# Patient Record
Sex: Female | Born: 1970 | Race: White | Hispanic: No | Marital: Married | Smoking: Current every day smoker
Health system: Southern US, Community
[De-identification: ages and names within clinical notes are randomized; demographics above are authoritative.]

## PROBLEM LIST (undated history)

## (undated) DIAGNOSIS — R6882 Decreased libido: Secondary | ICD-10-CM

## (undated) DIAGNOSIS — Z803 Family history of malignant neoplasm of breast: Secondary | ICD-10-CM

## (undated) DIAGNOSIS — K635 Polyp of colon: Secondary | ICD-10-CM

## (undated) DIAGNOSIS — I10 Essential (primary) hypertension: Secondary | ICD-10-CM

## (undated) HISTORY — DX: Decreased libido: R68.82

## (undated) HISTORY — PX: CHOLECYSTECTOMY, LAPAROSCOPIC: SHX56

## (undated) HISTORY — PX: TUBAL LIGATION: SHX77

## (undated) HISTORY — DX: Polyp of colon: K63.5

## (undated) HISTORY — DX: Essential (primary) hypertension: I10

## (undated) HISTORY — DX: Family history of malignant neoplasm of breast: Z80.3

## (undated) HISTORY — PX: OTHER SURGICAL HISTORY: SHX169

---

## 2006-11-05 HISTORY — PX: TUBAL LIGATION: SHX77

## 2014-07-17 ENCOUNTER — Observation Stay: Payer: Self-pay | Admitting: Obstetrics and Gynecology

## 2014-07-26 ENCOUNTER — Inpatient Hospital Stay: Payer: Self-pay

## 2014-07-26 LAB — BASIC METABOLIC PANEL
Anion Gap: 11 (ref 7–16)
BUN: 4 mg/dL — ABNORMAL LOW (ref 7–18)
Calcium, Total: 8.3 mg/dL — ABNORMAL LOW (ref 8.5–10.1)
Chloride: 105 mmol/L (ref 98–107)
Co2: 23 mmol/L (ref 21–32)
Creatinine: 0.68 mg/dL (ref 0.60–1.30)
EGFR (African American): >60
EGFR (Non-African Amer.): >60
Glucose: 93 mg/dL (ref 65–99)
Osmolality: 274 (ref 275–301)
Potassium: 2.6 mmol/L — ABNORMAL LOW (ref 3.5–5.1)
Sodium: 139 mmol/L (ref 136–145)

## 2014-07-26 LAB — HEMOGLOBIN: HGB: 9.1 g/dL — ABNORMAL LOW (ref 12.0–16.0)

## 2014-07-26 LAB — HEMATOCRIT: HCT: 27.4 % — ABNORMAL LOW (ref 35.0–47.0)

## 2014-07-26 LAB — SGOT (AST)(ARMC): SGOT(AST): 14 U/L — ABNORMAL LOW (ref 15–37)

## 2014-07-26 LAB — PROTEIN / CREATININE RATIO, URINE
Creatinine, Urine: 57.4 mg/dL (ref 30.0–125.0)
Protein, Random Urine: 18 mg/dL — ABNORMAL HIGH (ref 0–12)
Protein/Creat. Ratio: 314 mg/gCREAT — ABNORMAL HIGH (ref 0–200)

## 2014-07-26 LAB — RBC: RBC: 3.01 10*6/uL — ABNORMAL LOW (ref 3.80–5.20)

## 2014-07-26 LAB — PLATELET COUNT: Platelet: 165 10*3/uL (ref 150–440)

## 2014-07-26 LAB — WBC: WBC: 11 10*3/uL (ref 3.6–11.0)

## 2014-07-26 LAB — URIC ACID: Uric Acid: 5.4 mg/dL (ref 2.6–6.0)

## 2014-07-27 LAB — HEMATOCRIT: HCT: 24.9 % — ABNORMAL LOW (ref 35.0–47.0)

## 2015-03-15 NOTE — H&P (Signed)
L&D Evaluation:  History:  HPI Pt is a 44 yo G7P5015 at 39.[redacted] weeks GA with an EDC of 07/30/14 who presents to L&D after being sent over from the office due to her BP being 162/84. She reports a headache that has been persistant over the last "couple of days." She denies RUQ pain, blurred vision, or dizziness. She has a history of gestational HTN vs. pre-eclampsia with another pregnancy- early pregnancy PIH and 24 hour urine normal, anemia, asymptomatic bacteuria, and negative informaseq testing. She is A+, RI, VI, GBS negative. She recieved her Tdap this pregnancy.   Presents with other, [place] work up   Patient's Medical History anemia   Patient's Surgical History other  tooth extraction, BTL with reversal   Medications Pre Natal Vitamins  Iron   Allergies NKDA   Social History tobacco   Family History Non-Contributory   ROS:  ROS All systems were reviewed.  HEENT, CNS, GI, GU, Respiratory, CV, Renal and Musculoskeletal systems were found to be normal.   Exam:  Vital Signs BP 109-138/58-88   General no apparent distress   Mental Status clear   Chest clear   Heart normal sinus rhythm   Abdomen gravid, non-tender   Back no CVAT   Pelvic no external lesions, 4/50/-2 in clinic   Mebranes Intact   FHT 120's, +accels, 1 variable noted   Ucx absent   Skin dry, no lesions, no rashes   Lymph no lymphadenopathy   Other PIH panel- HGB- 9.1, HCT- 27.4, AST- 14, urine PC ratio- 314, uric acid- 5.4, BUN-4 K- 2.6   Impression:  Impression reactive NST, IUP at [redacted]w[redacted]d, elevated BP, proteinuria   Plan:  Plan EFM/NST, Pitocin, medication for pain relief, anticipate svd.   Comments Results discussed with Dr. Kenton Kingfisher- agreed to keep pt overnignt and start an IOL due to elevated BP's in the office, and patients history of GHTN vs. pre-eclampsia along with advanced cervical dilation.   Follow Up Appointment need to schedule   Electronic Signatures: Louisa Second (CNM)   (Signed 21-Sep-15 17:41)  Authored: L&D Evaluation   Last Updated: 21-Sep-15 17:41 by Louisa Second (CNM)

## 2017-03-15 ENCOUNTER — Ambulatory Visit (INDEPENDENT_AMBULATORY_CARE_PROVIDER_SITE_OTHER): Payer: Managed Care, Other (non HMO) | Admitting: Obstetrics and Gynecology

## 2017-03-15 ENCOUNTER — Encounter: Payer: Self-pay | Admitting: Obstetrics and Gynecology

## 2017-03-15 VITALS — BP 128/74 | Ht 66.0 in | Wt 153.0 lb

## 2017-03-15 DIAGNOSIS — R6882 Decreased libido: Secondary | ICD-10-CM

## 2017-03-15 DIAGNOSIS — Z1151 Encounter for screening for human papillomavirus (HPV): Secondary | ICD-10-CM

## 2017-03-15 DIAGNOSIS — Z124 Encounter for screening for malignant neoplasm of cervix: Secondary | ICD-10-CM

## 2017-03-15 DIAGNOSIS — Z01419 Encounter for gynecological examination (general) (routine) without abnormal findings: Secondary | ICD-10-CM

## 2017-03-15 DIAGNOSIS — Z1231 Encounter for screening mammogram for malignant neoplasm of breast: Secondary | ICD-10-CM | POA: Diagnosis not present

## 2017-03-15 DIAGNOSIS — Z803 Family history of malignant neoplasm of breast: Secondary | ICD-10-CM

## 2017-03-15 DIAGNOSIS — Z1239 Encounter for other screening for malignant neoplasm of breast: Secondary | ICD-10-CM

## 2017-03-15 NOTE — Progress Notes (Signed)
Chief Complaint  Patient presents with  . Annual Exam     HPI:      Felicia Huff is a 46 y.o. No obstetric history on file. who LMP was Patient's last menstrual period was 02/20/2017., presents today for her annual examination.  Her menses are regular every 28-30 days, lasting 4 days.  Dysmenorrhea mild, occurring first 1-2 days of flow. She does not have intermenstrual bleeding.  Sex activity: not sexually active due to no desire/libido. Sx worse since birth of youngest daughter, almost 3 yrs ago.  Last Pap: December 28, 2013  Results were: no abnormalities /neg HPV DNA  Hx of STDs: HPV  Last mammogram:[place] There is a FH of breast cancer in her mom, genetic testing not indicated. There is no FH of ovarian cancer. The patient does do self-breast exams.  Tobacco use: The patient currently smokes 1/2 packs of cigarettes per day for the past many years. Alcohol use: none Exercise: moderately active  She does get adequate calcium and Vitamin D in her diet.   History reviewed. No pertinent past medical history.  Past Surgical History:  Procedure Laterality Date  . CHOLECYSTECTOMY, LAPAROSCOPIC    . tubal ligation reversal    . VAGINAL DELIVERY      Family History  Problem Relation Age of Onset  . Breast cancer Mother 7    Social History   Social History  . Marital status: Married    Spouse name: N/A  . Number of children: N/A  . Years of education: N/A   Occupational History  . Not on file.   Social History Main Topics  . Smoking status: Current Every Day Smoker  . Smokeless tobacco: Never Used  . Alcohol use No  . Drug use: No  . Sexual activity: Not Currently     Comment: vasectomy   Other Topics Concern  . Not on file   Social History Narrative  . No narrative on file    No current outpatient prescriptions on file.  ROS:  Review of Systems  Constitutional: Negative for fever, malaise/fatigue and weight loss.  HENT: Negative for  congestion, ear pain and sinus pain.   Respiratory: Negative for cough, shortness of breath and wheezing.   Cardiovascular: Negative for chest pain, orthopnea and leg swelling.  Gastrointestinal: Negative for constipation, diarrhea, nausea and vomiting.  Genitourinary: Negative for dysuria, frequency, hematuria and urgency.       Breast ROS: negative   Musculoskeletal: Negative for back pain, joint pain and myalgias.  Skin: Negative for itching and rash.  Neurological: Negative for dizziness, tingling, focal weakness and headaches.  Endo/Heme/Allergies: Negative for environmental allergies. Does not bruise/bleed easily.  Psychiatric/Behavioral: Negative for depression and suicidal ideas. The patient is not nervous/anxious and does not have insomnia.     Objective: BP 128/74   Ht 5\' 6"  (1.676 m)   Wt 153 lb (69.4 kg)   LMP 02/20/2017   BMI 24.69 kg/m    Physical Exam  Constitutional: She is oriented to person, place, and time. She appears well-developed and well-nourished.  Genitourinary: Vagina normal and uterus normal. There is no rash or tenderness on the right labia. There is no rash or tenderness on the left labia. No erythema or tenderness in the vagina. No vaginal discharge found. Right adnexum does not display mass and does not display tenderness. Left adnexum does not display mass and does not display tenderness. Cervix does not exhibit motion tenderness or polyp. Uterus is not enlarged  or tender.  Neck: Normal range of motion. No thyromegaly present.  Cardiovascular: Normal rate, regular rhythm and normal heart sounds.   No murmur heard. Pulmonary/Chest: Effort normal and breath sounds normal. Right breast exhibits no mass, no nipple discharge, no skin change and no tenderness. Left breast exhibits no mass, no nipple discharge, no skin change and no tenderness.  Abdominal: Soft. There is no tenderness. There is no guarding.  Musculoskeletal: Normal range of motion.    Neurological: She is alert and oriented to person, place, and time. No cranial nerve deficit.  Psychiatric: She has a normal mood and affect. Her behavior is normal.  Vitals reviewed.   Assessment/Plan: Encounter for annual routine gynecological examination  Cervical cancer screening - Plan: IGP, Aptima HPV  Screening for HPV (human papillomavirus) - Plan: IGP, Aptima HPV  Screening for breast cancer - Pt to sched mammo. - Plan: MM SCREENING BREAST TOMO BILATERAL  Family history of breast cancer - Pt doesn't qualify for cancer genetic testing. Will cont to follow FH. - Plan: MM SCREENING BREAST TOMO BILATERAL  Decreased libido - Recommended [facility] in [place]. No med tx available. Discussed date night/romance.            GYN counsel mammography screening, adequate intake of calcium and vitamin D, diet and exercise     F/U  Return in about 1 year (around 03/15/2018).  Felicia B. Copland, PA-C 03/15/2017 11:08 AM

## 2017-03-19 LAB — IGP, APTIMA HPV
HPV Aptima: NEGATIVE
PAP Smear Comment: 0

## 2019-01-12 ENCOUNTER — Encounter: Payer: Self-pay | Admitting: Obstetrics and Gynecology

## 2019-01-12 ENCOUNTER — Ambulatory Visit (INDEPENDENT_AMBULATORY_CARE_PROVIDER_SITE_OTHER): Payer: Medicaid Other | Admitting: Obstetrics and Gynecology

## 2019-01-12 VITALS — BP 118/80 | HR 78 | Ht 66.0 in | Wt 162.0 lb

## 2019-01-12 DIAGNOSIS — Z0000 Encounter for general adult medical examination without abnormal findings: Secondary | ICD-10-CM

## 2019-01-12 DIAGNOSIS — Z01419 Encounter for gynecological examination (general) (routine) without abnormal findings: Secondary | ICD-10-CM

## 2019-01-12 DIAGNOSIS — Z1211 Encounter for screening for malignant neoplasm of colon: Secondary | ICD-10-CM

## 2019-01-12 DIAGNOSIS — Z1239 Encounter for other screening for malignant neoplasm of breast: Secondary | ICD-10-CM

## 2019-01-12 DIAGNOSIS — K635 Polyp of colon: Secondary | ICD-10-CM

## 2019-01-12 DIAGNOSIS — Z803 Family history of malignant neoplasm of breast: Secondary | ICD-10-CM

## 2019-01-12 NOTE — Progress Notes (Signed)
Chief Complaint  Patient presents with  . Gynecologic Exam     HPI:      [name] is a 48 y.o. No obstetric history on file. who LMP was Patient's last menstrual period was 01/05/2019 (approximate)., presents today for her annual examination.  Her menses are regular Q1-2 months, light spotting to lasting 3 days.  Dysmenorrhea mild, occurring first 1-2 days of flow. She does not have intermenstrual bleeding. She does have vasomotor sx.   Sex activity: not sexually active due to no desire/libido. Sx worse since birth of youngest daughter, almost 5 yrs ago. Recommended [facility] in [place] in past.  Last Pap: 03/15/17  Results were: no abnormalities /neg HPV DNA  Hx of STDs: HPV  Last mammogram: not done There is a FH of breast cancer in her mom, genetic testing not indicated. There is no FH of ovarian cancer. The patient does do self-breast exams.  Tobacco use: 1/2 to 1 ppd for many yrs; trying to cut down Alcohol use: none Exercise: moderately active  She does get adequate calcium but not Vitamin D in her diet. Labs with PCP.   Pt with hx of colon polyps on colonoscopy about 11 yrs ago in [place]. Pt was supposed to have repeat in 5 yrs but never done.  Past Medical History:  Diagnosis Date  . Colon polyps   . Decreased libido   . Family history of breast cancer     Past Surgical History:  Procedure Laterality Date  . CHOLECYSTECTOMY, LAPAROSCOPIC    . tubal ligation reversal    . VAGINAL DELIVERY      Family History  Problem Relation Age of Onset  . Breast cancer Mother 41       cured  . Ovarian cancer Neg Hx     Social History   Socioeconomic History  . Marital status: Married    Spouse name: Not on file  . Number of children: Not on file  . Years of education: Not on file  . Highest education level: Not on file  Occupational History  . Not on file  Social Needs  . Financial resource strain: Not on file  . Food insecurity:    Worry: Not on file   Inability: Not on file  . Transportation needs:    Medical: Not on file    Non-medical: Not on file  Tobacco Use  . Smoking status: Current Every Day Smoker    Packs/day: 1.00    Types: Cigarettes  . Smokeless tobacco: Never Used  Substance and Sexual Activity  . Alcohol use: No  . Drug use: No  . Sexual activity: Not Currently    Birth control/protection: Surgical    Comment: vasectomy  Lifestyle  . Physical activity:    Days per week: Not on file    Minutes per session: Not on file  . Stress: Not on file  Relationships  . Social connections:    Talks on phone: Not on file    Gets together: Not on file    Attends religious service: Not on file    Active member of club or organization: Not on file    Attends meetings of clubs or organizations: Not on file    Relationship status: Not on file  . Intimate partner violence:    Fear of current or ex partner: Not on file    Emotionally abused: Not on file    Physically abused: Not on file    Forced sexual  activity: Not on file  Other Topics Concern  . Not on file  Social History Narrative  . Not on file    No current outpatient medications on file.  ROS:  Review of Systems  Constitutional: Negative for fever, malaise/fatigue and weight loss.  HENT: Negative for congestion, ear pain and sinus pain.   Respiratory: Negative for cough, shortness of breath and wheezing.   Cardiovascular: Negative for chest pain, orthopnea and leg swelling.  Gastrointestinal: Negative for constipation, diarrhea, nausea and vomiting.  Genitourinary: Negative for dysuria, frequency, hematuria and urgency.       Breast ROS: negative   Musculoskeletal: Negative for back pain, joint pain and myalgias.  Skin: Negative for itching and rash.  Neurological: Negative for dizziness, tingling, focal weakness and headaches.  Endo/Heme/Allergies: Negative for environmental allergies. Does not bruise/bleed easily.  Psychiatric/Behavioral: Negative for  depression and suicidal ideas. The patient is not nervous/anxious and does not have insomnia.     Objective: BP 118/80   Pulse 78   Ht 5\' 6"  (1.676 m)   Wt 162 lb (73.5 kg)   LMP 01/05/2019 (Approximate)   BMI 26.15 kg/m    Physical Exam Constitutional:      Appearance: She is well-developed.  Genitourinary:     Vulva, vagina, uterus, right adnexa and left adnexa normal.     No vulval lesion or tenderness noted.     No vaginal discharge, erythema or tenderness.     No cervical motion tenderness or polyp.     Uterus is not enlarged or tender.     No right or left adnexal mass present.     Right adnexa not tender.     Left adnexa not tender.  Neck:     Musculoskeletal: Normal range of motion.     Thyroid: No thyromegaly.  Cardiovascular:     Rate and Rhythm: Normal rate and regular rhythm.     Heart sounds: Normal heart sounds. No murmur.  Pulmonary:     Effort: Pulmonary effort is normal.     Breath sounds: Normal breath sounds.  Chest:     Breasts:        Right: No mass, nipple discharge, skin change or tenderness.        Left: No mass, nipple discharge, skin change or tenderness.  Abdominal:     Palpations: Abdomen is soft.     Tenderness: There is no abdominal tenderness. There is no guarding.  Musculoskeletal: Normal range of motion.  Neurological:     General: No focal deficit present.     Mental Status: She is alert and oriented to person, place, and time.     Cranial Nerves: No cranial nerve deficit.  Skin:    General: Skin is warm and dry.  Psychiatric:        Mood and Affect: Mood normal.        Behavior: Behavior normal.        Thought Content: Thought content normal.        Judgment: Judgment normal.  Vitals signs reviewed.     Assessment/Plan: Encounter for annual routine gynecological examination  Screening for breast cancer - Pt to sched mammo - Plan: MM 3D SCREEN BREAST BILATERAL  Family history of breast cancer - Doesn't meet genetic  testing guidelines. Will cont to follow [place]  Screening for colon cancer - Refer to GI for colonoscopy due to hx of colon polyps in past. - Plan: Ambulatory referral to Gastroenterology  Polyp of colon, unspecified  part of colon, unspecified type - Plan: Ambulatory referral to Gastroenterology           GYN counsel mammography screening, adequate intake of calcium and vitamin D, diet and exercise     F/U  Return in about 1 year (around 01/12/2020).  Cataleia Gade B. Virlee Stroschein, PA-C 01/12/2019 2:17 PM

## 2019-01-12 NOTE — Patient Instructions (Addendum)
I value your feedback and entrusting us with your care. If you get a [facility] patient survey, I would appreciate you taking the time to let us know about your experience today. Thank you!  [facility]: [phone]    

## 2019-01-21 ENCOUNTER — Other Ambulatory Visit: Payer: Self-pay

## 2019-01-21 ENCOUNTER — Ambulatory Visit
Admission: RE | Admit: 2019-01-21 | Discharge: 2019-01-21 | Disposition: A | Discharge status: Home / Self Care | Payer: Medicaid Other | Source: Ambulatory Visit | Attending: Obstetrics and Gynecology | Admitting: Obstetrics and Gynecology

## 2019-01-21 DIAGNOSIS — Z1231 Encounter for screening mammogram for malignant neoplasm of breast: Secondary | ICD-10-CM | POA: Insufficient documentation

## 2019-01-21 DIAGNOSIS — Z1239 Encounter for other screening for malignant neoplasm of breast: Secondary | ICD-10-CM

## 2019-01-27 ENCOUNTER — Encounter: Payer: Self-pay | Admitting: Obstetrics and Gynecology

## 2019-02-16 ENCOUNTER — Encounter: Payer: Self-pay | Admitting: *Deleted

## 2020-11-30 ENCOUNTER — Encounter: Payer: Self-pay | Admitting: *Deleted

## 2020-12-20 ENCOUNTER — Other Ambulatory Visit: Payer: Self-pay

## 2020-12-20 ENCOUNTER — Ambulatory Visit (INDEPENDENT_AMBULATORY_CARE_PROVIDER_SITE_OTHER): Payer: Self-pay | Admitting: *Deleted

## 2020-12-20 VITALS — Ht 66.0 in | Wt 156.6 lb

## 2020-12-20 DIAGNOSIS — Z1211 Encounter for screening for malignant neoplasm of colon: Secondary | ICD-10-CM

## 2020-12-20 MED ORDER — NA SULFATE-K SULFATE-MG SULF 17.5-3.13-1.6 GM/177ML PO SOLN: 1.0000 | Freq: Once | ORAL | 0 refills | Status: AC

## 2020-12-20 NOTE — Patient Instructions (Addendum)
Felicia Huff  13-Feb-1971 MRN: [id number]     Procedure Date: 01/09/2021 Time to register: You will receive a call from the hospital a few days before your procedure. Place to register: [facility] Scheduled provider: Dr. Abbey Chatters    PREPARATION FOR COLONOSCOPY WITH SUPREP BOWEL PREP KIT  Note: Suprep Bowel Prep Kit is a split-dose (2day) regimen. Consumption of BOTH 6-ounce bottles is required for a complete prep.  Please notify us immediately if you are diabetic, take iron supplements, or if you are on Coumadin or any other blood thinners.  Please hold the following medications: n/a                                                                                                                                                  2 DAYS BEFORE PROCEDURE:  DATE: 01/07/2021   DAY: Saturday Begin clear liquid diet AFTER your lunch meal. NO SOLID FOODS after this point.  1 DAY BEFORE PROCEDURE:  DATE:  01/08/2021  DAY: Sunday Continue clear liquids the entire day - NO SOLID FOOD.   Diabetic medications adjustments for today: n/a  At 8:00am: Complete steps 1 through 4 below, using ONE (1) 6-ounce bottle, before going to bed. Step 1:  Pour ONE (1) 6-ounce bottle of SUPREP liquid into the mixing container.  Step 2:  Add cool drinking water to the 16 ounce line on the container and mix.  Note: Dilute the solution concentrate as directed prior to use. Step 3:  DRINK ALL the liquid in the container. Step 4:  You MUST drink an additional two (2) or more 16 ounce containers of water over the next one (1) hour.    At 6:00pm: Complete steps 1 through 4 below, using ONE (1) 6-ounce bottle, before going to bed. Step 1:  Pour ONE (1) 6-ounce bottle of SUPREP liquid into the mixing container.  Step 2:  Add cool drinking water to the 16 ounce line on the container and mix.  Note: Dilute the solution concentrate as directed prior to use. Step 3:  DRINK ALL the liquid in the container. Step 4:   You MUST drink an additional two (2) or more 16 ounce containers of water over the next one (1) hour.   Continue clear liquids.  DAY OF PROCEDURE:   DATE:  01/09/2021  DAY: Monday If you take medications for your heart, blood pressure, or breathing, you may take these medications.  Diabetic medications adjustments for today: n/a  Please see below for Dietary Information.  CLEAR LIQUIDS INCLUDE:  Water Jello (NOT red in color)   Ice Popsicles (NOT red in color)   Tea (sugar ok, no milk/cream) Powdered fruit flavored drinks  Coffee (sugar ok, no milk/cream) Gatorade/ Lemonade/ Kool-Aid  (NOT red in color)   Juice: apple, white grape, white cranberry Soft drinks  Clear bullion, consomme, broth (fat free beef/chicken/vegetable)  Carbonated beverages (any kind)  Strained chicken noodle soup Hard Candy   Remember: Clear liquids are liquids that will allow you to see your fingers on the other side of a clear glass. Be sure liquids are NOT red in color, and not cloudy, but CLEAR.  DO NOT EAT OR DRINK ANY OF THE FOLLOWING:  Dairy products of any kind   Cranberry juice Tomato juice / V8 juice   Grapefruit juice Orange juice     Red grape juice  Do not eat any solid foods, including such foods as: cereal, oatmeal, yogurt, fruits, vegetables, creamed soups, eggs, bread, crackers, pureed foods in a blender, etc.   HELPFUL HINTS FOR DRINKING PREP SOLUTION:   Make sure prep is extremely cold. Mix and refrigerate the the morning of the prep. You may also put in the freezer.   You may try mixing some Crystal Light or Country Time Lemonade if you prefer. Mix in small amounts; add more if necessary.  Try drinking through a straw  Rinse mouth with water or a mouthwash between glasses, to remove after-taste.  Try sipping on a cold beverage /ice/ popsicles between glasses of prep.  Place a piece of sugar-free hard candy in mouth between glasses.  If you become nauseated, try consuming smaller  amounts, or stretch out the time between glasses. Stop for 30-60 minutes, then slowly start back drinking.     OTHER INSTRUCTIONS  You will need a responsible adult at least 52 years of age to accompany you and drive you home. This person must remain in the waiting room during your procedure. The hospital will cancel your procedure if you do not have a responsible adult with you.   1. Wear loose fitting clothing that is easily removed. 2. Leave jewelry and other valuables at home.  3. Remove all body piercing jewelry and leave at home. 4. Total time from sign-in until discharge is approximately 2-3 hours. 5. You should go home directly after your procedure and rest. You can resume normal activities the day after your procedure. 6. The day of your procedure you should not:  Drive  Make legal decisions  Operate machinery  Drink alcohol  Return to work   You may call the office (Dept: [phone]) before 5:00pm, or page the doctor on call [phone]) after 5:00pm, for further instructions, if necessary.   Insurance Information YOU WILL NEED TO CHECK WITH YOUR INSURANCE COMPANY FOR THE BENEFITS OF COVERAGE YOU HAVE FOR THIS PROCEDURE.  UNFORTUNATELY, NOT ALL INSURANCE COMPANIES HAVE BENEFITS TO COVER ALL OR PART OF THESE TYPES OF PROCEDURES.  IT IS YOUR RESPONSIBILITY TO CHECK YOUR BENEFITS, HOWEVER, WE WILL BE GLAD TO ASSIST YOU WITH ANY CODES YOUR INSURANCE COMPANY MAY NEED.    PLEASE NOTE THAT MOST INSURANCE COMPANIES WILL NOT COVER A SCREENING COLONOSCOPY FOR PEOPLE UNDER THE AGE OF 50  IF YOU HAVE BCBS INSURANCE, YOU MAY HAVE BENEFITS FOR A SCREENING COLONOSCOPY BUT IF POLYPS ARE FOUND THE DIAGNOSIS WILL CHANGE AND THEN YOU MAY HAVE A DEDUCTIBLE THAT WILL NEED TO BE MET. SO PLEASE MAKE SURE YOU CHECK YOUR BENEFITS FOR A SCREENING COLONOSCOPY AS WELL AS A DIAGNOSTIC COLONOSCOPY.

## 2020-12-20 NOTE — Progress Notes (Addendum)
Gastroenterology Pre-Procedure Review  Request Date: 12/20/2020 Requesting Physician: Dr. Curtis Sites, Last TCS done 14 years or more in [place], [place], pt could not remember the doctor who did it, polyps per pt  PATIENT REVIEW QUESTIONS: The patient responded to the following health history questions as indicated:    1. Diabetes Melitis: no 2. Joint replacements in the past 12 months: no 3. Major health problems in the past 3 months: no 4. Has an artificial valve or MVP: no 5. Has a defibrillator: no 6. Has been advised in past to take antibiotics in advance of a procedure like teeth cleaning: no 7. Family history of colon cancer: no 8. Alcohol Use: no 9. Illicit drug Use: no 10. History of sleep apnea: no  11. History of coronary artery or other vascular stents placed within the last 12 months: no 12. History of any prior anesthesia complications: no 13. Body mass index is 25.28 kg/m.    MEDICATIONS & ALLERGIES:    Patient reports the following regarding taking any blood thinners:   Plavix? no Aspirin? no Coumadin? no Brilinta? no Xarelto? no Eliquis? no Pradaxa? no Savaysa? no Effient? no  Patient confirms/reports the following medications:  No current outpatient medications on file.   No current facility-administered medications for this visit.    Patient confirms/reports the following allergies:  No Known Allergies  No orders of the defined types were placed in this encounter.   AUTHORIZATION INFORMATION Primary Insurance: [place] Medicaid,  ID #:[id number] L ,  Group #: [facility] Pre-Cert / Auth required: Yes, approved online 06/11/7671-0/07/4708 Pre-Cert / Auth #: [id number]  SCHEDULE INFORMATION: Procedure has been scheduled as follows:  Date: 01/09/2021 , Time: AM procedure Location: [facility] with Dr. Abbey Chatters  This Gastroenterology Pre-Precedure Review Form is being routed to the following provider(s): Roseanne Kaufman, NP

## 2020-12-21 NOTE — Progress Notes (Signed)
ASA 1 appropriate

## 2020-12-28 ENCOUNTER — Other Ambulatory Visit: Payer: Self-pay | Admitting: *Deleted

## 2021-01-02 ENCOUNTER — Other Ambulatory Visit (HOSPITAL_COMMUNITY): Payer: Self-pay | Admitting: Family Medicine

## 2021-01-02 DIAGNOSIS — Z1231 Encounter for screening mammogram for malignant neoplasm of breast: Secondary | ICD-10-CM

## 2021-01-03 ENCOUNTER — Encounter (HOSPITAL_COMMUNITY): Payer: Self-pay

## 2021-01-06 ENCOUNTER — Other Ambulatory Visit: Payer: Self-pay

## 2021-01-06 ENCOUNTER — Other Ambulatory Visit (HOSPITAL_COMMUNITY)
Admission: RE | Admit: 2021-01-06 | Discharge: 2021-01-06 | Disposition: A | Discharge status: Home / Self Care | Payer: Medicaid Other | Source: Ambulatory Visit | Attending: Internal Medicine | Admitting: Internal Medicine

## 2021-01-06 DIAGNOSIS — Z20822 Contact with and (suspected) exposure to covid-19: Secondary | ICD-10-CM | POA: Insufficient documentation

## 2021-01-06 DIAGNOSIS — Z01812 Encounter for preprocedural laboratory examination: Secondary | ICD-10-CM | POA: Insufficient documentation

## 2021-01-06 LAB — PREGNANCY, URINE: Preg Test, Ur: NEGATIVE

## 2021-01-06 LAB — SARS CORONAVIRUS 2 (TAT 6-24 HRS): SARS Coronavirus 2: NEGATIVE

## 2021-01-09 ENCOUNTER — Ambulatory Visit (HOSPITAL_COMMUNITY)
Admission: RE | Admit: 2021-01-09 | Discharge: 2021-01-09 | Disposition: A | Discharge status: Home / Self Care | Payer: Medicaid Other | Attending: Internal Medicine | Admitting: Internal Medicine

## 2021-01-09 ENCOUNTER — Ambulatory Visit (HOSPITAL_COMMUNITY): Payer: Medicaid Other | Admitting: Anesthesiology

## 2021-01-09 ENCOUNTER — Encounter (HOSPITAL_COMMUNITY): Payer: Self-pay

## 2021-01-09 ENCOUNTER — Encounter (HOSPITAL_COMMUNITY)
Admission: RE | Disposition: A | Discharge status: Home / Self Care | Payer: Self-pay | Source: Home / Self Care | Attending: Internal Medicine

## 2021-01-09 ENCOUNTER — Other Ambulatory Visit: Payer: Self-pay

## 2021-01-09 DIAGNOSIS — Z86010 Personal history of colonic polyps: Secondary | ICD-10-CM | POA: Diagnosis not present

## 2021-01-09 DIAGNOSIS — F17210 Nicotine dependence, cigarettes, uncomplicated: Secondary | ICD-10-CM | POA: Diagnosis not present

## 2021-01-09 DIAGNOSIS — D490 Neoplasm of unspecified behavior of digestive system: Secondary | ICD-10-CM

## 2021-01-09 DIAGNOSIS — K648 Other hemorrhoids: Secondary | ICD-10-CM | POA: Diagnosis not present

## 2021-01-09 DIAGNOSIS — K635 Polyp of colon: Secondary | ICD-10-CM | POA: Diagnosis not present

## 2021-01-09 DIAGNOSIS — D124 Benign neoplasm of descending colon: Secondary | ICD-10-CM | POA: Insufficient documentation

## 2021-01-09 DIAGNOSIS — Z1211 Encounter for screening for malignant neoplasm of colon: Secondary | ICD-10-CM | POA: Diagnosis not present

## 2021-01-09 DIAGNOSIS — D123 Benign neoplasm of transverse colon: Secondary | ICD-10-CM | POA: Diagnosis not present

## 2021-01-09 DIAGNOSIS — K5730 Diverticulosis of large intestine without perforation or abscess without bleeding: Secondary | ICD-10-CM | POA: Insufficient documentation

## 2021-01-09 HISTORY — PX: POLYPECTOMY: SHX5525

## 2021-01-09 HISTORY — PX: COLONOSCOPY WITH PROPOFOL: SHX5780

## 2021-01-09 HISTORY — PX: BIOPSY: SHX5522

## 2021-01-09 SURGERY — COLONOSCOPY WITH PROPOFOL
Anesthesia: General

## 2021-01-09 MED ORDER — PROPOFOL 10 MG/ML IV BOLUS
INTRAVENOUS | Status: DC | PRN
Start: 2021-01-09 — End: 2021-01-09
  Administered 2021-01-09: 20 mg via INTRAVENOUS
  Administered 2021-01-09: 100 mg via INTRAVENOUS
  Administered 2021-01-09 (×2): 20 mg via INTRAVENOUS

## 2021-01-09 MED ORDER — STERILE WATER FOR IRRIGATION IR SOLN
Status: DC | PRN
  Administered 2021-01-09: 100 mL

## 2021-01-09 MED ORDER — CHLORHEXIDINE GLUCONATE CLOTH 2 % EX PADS: 6.0000 | MEDICATED_PAD | Freq: Once | CUTANEOUS | Status: DC

## 2021-01-09 MED ORDER — SPOT INK MARKER SYRINGE KIT
PACK | SUBMUCOSAL | Status: DC | PRN
  Administered 2021-01-09: 3 mL via SUBMUCOSAL

## 2021-01-09 MED ORDER — LIDOCAINE HCL (CARDIAC) PF 50 MG/5ML IV SOSY
PREFILLED_SYRINGE | INTRAVENOUS | Status: DC | PRN
  Administered 2021-01-09: 50 mg via INTRAVENOUS

## 2021-01-09 MED ORDER — PROPOFOL 500 MG/50ML IV EMUL
INTRAVENOUS | Status: DC | PRN
  Administered 2021-01-09 (×2): 100 ug/kg/min via INTRAVENOUS

## 2021-01-09 MED ORDER — LACTATED RINGERS IV SOLN: INTRAVENOUS | Status: DC

## 2021-01-09 NOTE — Op Note (Signed)
[facility] Patient Name: Felicia Huff Procedure Date: 01/09/2021 9:52 AM MRN: [id number] Date of Birth: [birth year] Attending MD: Elon Alas. Edgar Frisk CSN: [id number] Age: 50 Admit Type: Outpatient Procedure:                Colonoscopy Indications:              High risk colon cancer surveillance: Personal                            history of colonic polyps Providers:                Elon Alas. Abbey Chatters, DO, Lambert Mody, Aram Candela Referring MD:              Medicines:                See the Anesthesia note for documentation of the                            administered medications Complications:            No immediate complications. Estimated Blood Loss:     Estimated blood loss was minimal. Procedure:                Pre-Anesthesia Assessment:                           - The anesthesia plan was to use monitored                            anesthesia care (MAC).                           After obtaining informed consent, the colonoscope                            was passed under direct vision. Throughout the                            procedure, the patient's blood pressure, pulse, and                            oxygen saturations were monitored continuously. The                            [id number]-H190DL ([id number]) scope was introduced through                            the anus and advanced to the the cecum, identified                            by appendiceal orifice and ileocecal valve. The                            colonoscopy was performed without difficulty. The  patient tolerated the procedure well. The quality                            of the bowel preparation was evaluated using the                            BBPS [facility] Bowel Preparation Scale) with scores                            of: Right Colon = 2 (minor amount of residual                            staining, small fragments of stool and/or opaque                             liquid, but mucosa seen well), Transverse Colon = 3                            (entire mucosa seen well with no residual staining,                            small fragments of stool or opaque liquid) and Left                            Colon = 3 (entire mucosa seen well with no residual                            staining, small fragments of stool or opaque                            liquid). The total BBPS score equals 8. The quality                            of the bowel preparation was good. Scope In: 10:11:09 AM Scope Out: 10:42:58 AM Scope Withdrawal Time: 0 hours 27 minutes 41 seconds  Total Procedure Duration: 0 hours 31 minutes 49 seconds  Findings:      The perianal and digital rectal examinations were normal.      Internal hemorrhoids were found during endoscopy.      A few small-mouthed diverticula were found in the sigmoid colon.      A polypoid and submucosal non-obstructing mass was found in the       transverse colon at approx 70 cm from anal verge. The mass was       non-circumferential. In addition, its diameter measured twenty-five mm.       No bleeding was present. Biopsies were taken with a cold forceps for       histology. Area distal was tattooed in a 3 quadrant fashion with an       injection of Spot (carbon black).      A 12 mm polyp was found in the transverse colon. The polyp was sessile.       The polyp was removed with a cold snare. Resection and retrieval were       complete.  A 20 mm polyp was found in the descending colon at approx 48cm from anal       verge. The polyp was multi-lobulated and umbilicated. Attempted to lift       polyp with Orise and was unsuccessful. Biopsies were taken with a cold       forceps for histology. Area distal was tattooed with an injection of       Spot (carbon black). Impression:               - Internal hemorrhoids.                           - Diverticulosis in the sigmoid colon.                            - Rule out malignancy, tumor in the transverse                            colon. Biopsied. Tattooed.                           - One 12 mm polyp in the transverse colon, removed                            with a cold snare. Resected and retrieved.                           - One 20 mm polyp in the descending colon.                            Biopsied. Tattooed. Moderate Sedation:      Per Anesthesia Care Recommendation:           - Patient has a contact number available for                            emergencies. The signs and symptoms of potential                            delayed complications were discussed with the                            patient. Return to normal activities tomorrow.                            Written discharge instructions were provided to the                            patient.                           - Resume previous diet.                           - Continue present medications.                           -  Await pathology results.                           - Repeat colonoscopy after studies are complete for                            surveillance based on pathology results.                           - The findings and recommendations were discussed                            with the surgeon. Will likely need referal to                            surgery once path results reviewed. Procedure Code(s):        --- Professional ---                           [phone], Colonoscopy, flexible; with removal of                            tumor(s), polyp(s), or other lesion(s) by snare                            technique                           45381, Colonoscopy, flexible; with directed                            submucosal injection(s), any substance                           01779, 29, Colonoscopy, flexible; with biopsy,                            single or multiple Diagnosis Code(s):        --- Professional ---                           Z86.010,  Personal history of colonic polyps                           K64.8, Other hemorrhoids                           D49.0, Neoplasm of unspecified behavior of                            digestive system                           K63.5, Polyp of colon                           K57.30, Diverticulosis of large intestine without  perforation or abscess without bleeding CPT copyright 2019 American Medical Association. All rights reserved. The codes documented in this report are preliminary and upon coder review may  be revised to meet current compliance requirements. Elon Alas. Abbey Chatters, DO [place] Abbey Chatters, DO 01/09/2021 10:51:24 AM This report has been signed electronically. Number of Addenda: 0

## 2021-01-09 NOTE — Discharge Instructions (Addendum)
Colonoscopy Discharge Instructions  Read the instructions outlined below and refer to this sheet in the next few weeks. These discharge instructions provide you with general information on caring for yourself after you leave the hospital. Your doctor may also give you specific instructions. While your treatment has been planned according to the most current medical practices available, unavoidable complications occasionally occur.   ACTIVITY  You may resume your regular activity, but move at a slower pace for the next 24 hours.   Take frequent rest periods for the next 24 hours.   Walking will help get rid of the air and reduce the bloated feeling in your belly (abdomen).   No driving for 24 hours (because of the medicine (anesthesia) used during the test).    Do not sign any important legal documents or operate any machinery for 24 hours (because of the anesthesia used during the test).  NUTRITION  Drink plenty of fluids.   You may resume your normal diet as instructed by your doctor.   Begin with a light meal and progress to your normal diet. Heavy or fried foods are harder to digest and may make you feel sick to your stomach (nauseated).   Avoid alcoholic beverages for 24 hours or as instructed.  MEDICATIONS  You may resume your normal medications unless your doctor tells you otherwise.  WHAT YOU CAN EXPECT TODAY  Some feelings of bloating in the abdomen.   Passage of more gas than usual.   Spotting of blood in your stool or on the toilet paper.  IF YOU HAD POLYPS REMOVED DURING THE COLONOSCOPY:  No aspirin products for 7 days or as instructed.   No alcohol for 7 days or as instructed.   Eat a soft diet for the next 24 hours.  FINDING OUT THE RESULTS OF YOUR TEST Not all test results are available during your visit. If your test results are not back during the visit, make an appointment with your caregiver to find out the results. Do not assume everything is normal if  you have not heard from your caregiver or the medical facility. It is important for you to follow up on all of your test results.  SEEK IMMEDIATE MEDICAL ATTENTION IF:  You have more than a spotting of blood in your stool.   Your belly is swollen (abdominal distention).   You are nauseated or vomiting.   You have a temperature over 101.   You have abdominal pain or discomfort that is severe or gets worse throughout the day.   Unfortunately appears that you have 2 rather large polyps 1 of which I am concerned may have underlying cancerous cells.  I took biopsies of both, we will have these results back within 1 to 2 days.  More than likely you will need surgical resection of this part of your colon.  They are near each other in your colon so hopefully this can be taken care of with one simple resection.  I have already discussed the case with surgery.  After pathology results reviewed we will likely make this referral.  I will be in touch in the next 2 days.  I hope you have a great rest of your week!  Elon Alas. Abbey Chatters, D.O. Gastroenterology and Hepatology [facility]      Monitored Anesthesia Care, Care After This sheet gives you information about how to care for yourself after your procedure. Your health care provider may also give you more specific instructions. If you  have problems or questions, contact your health care provider. What can I expect after the procedure? After the procedure, it is common to have:  Tiredness.  Forgetfulness about what happened after the procedure.  Impaired judgment for important decisions.  Nausea or vomiting.  Some difficulty with balance. Follow these instructions at home: For the time period you were told by your health care provider:  Rest as needed.  Do not participate in activities where you could fall or become injured.  Do not drive or use machinery.  Do not drink alcohol.  Do not take sleeping  pills or medicines that cause drowsiness.  Do not make important decisions or sign legal documents.  Do not take care of children on your own.      Eating and drinking  Follow the diet that is recommended by your health care provider.  Drink enough fluid to keep your urine pale yellow.  If you vomit: ? Drink water, juice, or soup when you can drink without vomiting. ? Make sure you have little or no nausea before eating solid foods. General instructions  Have a responsible adult stay with you for the time you are told. It is important to have someone help care for you until you are awake and alert.  Take over-the-counter and prescription medicines only as told by your health care provider.  If you have sleep apnea, surgery and certain medicines can increase your risk for breathing problems. Follow instructions from your health care provider about wearing your sleep device: ? Anytime you are sleeping, including during daytime naps. ? While taking prescription pain medicines, sleeping medicines, or medicines that make you drowsy.  Avoid smoking.  Keep all follow-up visits as told by your health care provider. This is important. Contact a health care provider if:  You keep feeling nauseous or you keep vomiting.  You feel light-headed.  You are still sleepy or having trouble with balance after 24 hours.  You develop a rash.  You have a fever.  You have redness or swelling around the IV site. Get help right away if:  You have trouble breathing.  You have new-onset confusion at home. Summary  For several hours after your procedure, you may feel tired. You may also be forgetful and have poor judgment.  Have a responsible adult stay with you for the time you are told. It is important to have someone help care for you until you are awake and alert.  Rest as told. Do not drive or operate machinery. Do not drink alcohol or take sleeping pills.  Get help right away if you  have trouble breathing, or if you suddenly become confused. This information is not intended to replace advice given to you by your health care provider. Make sure you discuss any questions you have with your health care provider. Document Revised: 07/07/2020 Document Reviewed: 09/24/2019 Elsevier Patient Education  2021 [employer].     Colon Polyps  Colon polyps are tissue growths inside the colon, which is part of the large intestine. They are one of the types of polyps that can grow in the body. A polyp may be a round bump or a mushroom-shaped growth. You could have one polyp or more than one. Most colon polyps are noncancerous (benign). However, some colon polyps can become cancerous over time. Finding and removing the polyps early can help prevent this. What are the causes? The exact cause of colon polyps is not known. What increases the risk? The following factors  may make you more likely to develop this condition:  Having a family history of colorectal cancer or colon polyps.  Being older than 52 years of age.  Being younger than 52 years of age and having a significant family history of colorectal cancer or colon polyps or a genetic condition that puts you at higher risk of getting colon polyps.  Having inflammatory bowel disease, such as ulcerative colitis or Crohn's disease.  Having certain conditions passed from parent to child (hereditary conditions), such as: ? Familial adenomatous polyposis (FAP). ? Lynch syndrome. ? Turcot syndrome. ? Peutz-Jeghers syndrome. ? MUTYH-associated polyposis (MAP).  Being overweight.  Certain lifestyle factors. These include smoking cigarettes, drinking too much alcohol, not getting enough exercise, and eating a diet that is high in fat and red meat and low in fiber.  Having had childhood cancer that was treated with radiation of the abdomen. What are the signs or symptoms? Many times, there are no symptoms. If you have symptoms,  they may include:  Blood coming from the rectum during a bowel movement.  Blood in the stool (feces). The blood may be bright red or very dark in color.  Pain in the abdomen.  A change in bowel habits, such as constipation or diarrhea. How is this diagnosed? This condition is diagnosed with a colonoscopy. This is a procedure in which a lighted, flexible scope is inserted into the opening between the buttocks (anus) and then passed into the colon to examine the area. Polyps are sometimes found when a colonoscopy is done as part of routine cancer screening tests. How is this treated? This condition is treated by removing any polyps that are found. Most polyps can be removed during a colonoscopy. Those polyps will then be tested for cancer. Additional treatment may be needed depending on the results of testing. Follow these instructions at home: Eating and drinking  Eat foods that are high in fiber, such as fruits, vegetables, and whole grains.  Eat foods that are high in calcium and vitamin D, such as milk, cheese, yogurt, eggs, liver, fish, and broccoli.  Limit foods that are high in fat, such as fried foods and desserts.  Limit the amount of red meat, precooked or cured meat, or other processed meat that you eat, such as hot dogs, sausages, bacon, or meat loaves.  Limit sugary drinks.   Lifestyle  Maintain a healthy weight, or lose weight if recommended by your health care provider.  Exercise every day or as told by your health care provider.  Do not use any products that contain nicotine or tobacco, such as cigarettes, e-cigarettes, and chewing tobacco. If you need help quitting, ask your health care provider.  Do not drink alcohol if: ? Your health care provider tells you not to drink. ? You are pregnant, may be pregnant, or are planning to become pregnant.  If you drink alcohol: ? Limit how much you use to:  0-1 drink a day for women.  0-2 drinks a day for men. ? Know  how much alcohol is in your drink. In the [place] one drink equals one 12 oz bottle of beer (355 mL), one 5 oz glass of wine (148 mL), or one 1 oz glass of hard liquor (44 mL). General instructions  Take over-the-counter and prescription medicines only as told by your health care provider.  Keep all follow-up visits. This is important. This includes having regularly scheduled colonoscopies. Talk to your health care provider about when you need a colonoscopy.  Contact a health care provider if:  You have new or worsening bleeding during a bowel movement.  You have new or increased blood in your stool.  You have a change in bowel habits.  You lose weight for no known reason. Summary  Colon polyps are tissue growths inside the colon, which is part of the large intestine. They are one type of polyp that can grow in the body.  Most colon polyps are noncancerous (benign), but some can become cancerous over time.  This condition is diagnosed with a colonoscopy.  This condition is treated by removing any polyps that are found. Most polyps can be removed during a colonoscopy. This information is not intended to replace advice given to you by your health care provider. Make sure you discuss any questions you have with your health care provider. Document Revised: 02/10/2020 Document Reviewed: 02/10/2020 Elsevier Patient Education  2021 [employer].

## 2021-01-09 NOTE — Anesthesia Postprocedure Evaluation (Signed)
Anesthesia Post Note  Patient: Felicia Huff  Procedure(s) Performed: COLONOSCOPY WITH PROPOFOL (N/A ) BIOPSY POLYPECTOMY  Patient location during evaluation: Endoscopy Anesthesia Type: General Level of consciousness: awake and alert and patient cooperative Pain management: satisfactory to patient Vital Signs Assessment: post-procedure vital signs reviewed and stable Respiratory status: spontaneous breathing Cardiovascular status: stable Postop Assessment: no apparent nausea or vomiting Anesthetic complications: no   No complications documented.   Last Vitals:  Vitals:   01/09/21 0927 01/09/21 1049  BP: (!) 129/93 129/70  Pulse: 83 70  Resp: 17 17  Temp: 37 C 36.6 C  SpO2: 99% 98%    Last Pain:  Vitals:   01/09/21 1049  TempSrc: Oral  PainSc: 0-No pain                 Zarius Furr

## 2021-01-09 NOTE — Transfer of Care (Signed)
Immediate Anesthesia Transfer of Care Note  Patient: Felicia Huff  Procedure(s) Performed: COLONOSCOPY WITH PROPOFOL (N/A ) BIOPSY POLYPECTOMY  Patient Location: Endoscopy Unit  Anesthesia Type:General  Level of Consciousness: awake and patient cooperative  Airway & Oxygen Therapy: Patient Spontanous Breathing  Post-op Assessment: Report given to RN and Post -op Vital signs reviewed and unstable, Anesthesiologist notified  Post vital signs: Reviewed and stable  Last Vitals:  Vitals Value Taken Time  BP    Temp    Pulse    Resp    SpO2     SEE VITAL SIGN FLOW SHEET Last Pain:  Vitals:   01/09/21 1005  TempSrc:   PainSc: 0-No pain      Patients Stated Pain Goal: 7 (00/51/10 2111)  Complications: No complications documented.

## 2021-01-09 NOTE — H&P (Signed)
Primary Care Physician:  Patient, No Pcp Per Primary Gastroenterologist:  Dr. Abbey Chatters  Pre-Procedure History & Physical: HPI:  Felicia Huff is a 50 y.o. female is here for a colonoscopy to be performed for surveillance due to personal history of adenomatous colon polyps. Notes last TCS 14 years ago with multiple polyps removed.   Patient denies any family history of colorectal cancer.  No melena or hematochezia.  No abdominal pain or unintentional weight loss.  No change in bowel habits.  Overall feels well from a GI standpoint.  Past Medical History:  Diagnosis Date  . Colon polyps   . Decreased libido   . Family history of breast cancer     Past Surgical History:  Procedure Laterality Date  . CHOLECYSTECTOMY, LAPAROSCOPIC    . tubal ligation reversal    . VAGINAL DELIVERY      Prior to Admission medications   Medication Sig Start Date End Date Taking? Authorizing Provider  SUPREP BOWEL PREP KIT 17.5-3.13-1.6 GM/177ML SOLN Take 354 mLs by mouth. 12/20/20  Yes [provider]    Allergies as of 12/22/2020  . (No Known Allergies)    Family History  Problem Relation Age of Onset  . Breast cancer Mother 59       cured  . Ovarian cancer Neg Hx     Social History   Socioeconomic History  . Marital status: Married    Spouse name: Not on file  . Number of children: Not on file  . Years of education: Not on file  . Highest education level: Not on file  Occupational History  . Not on file  Tobacco Use  . Smoking status: Current Every Day Smoker    Packs/day: 1.00    Types: Cigarettes  . Smokeless tobacco: Never Used  Vaping Use  . Vaping Use: Never used  Substance and Sexual Activity  . Alcohol use: No  . Drug use: No  . Sexual activity: Not Currently    Birth control/protection: Surgical    Comment: vasectomy  Other Topics Concern  . Not on file  Social History Narrative  . Not on file   Social Determinants of Health   Financial Resource Strain:  Not on file  Food Insecurity: Not on file  Transportation Needs: Not on file  Physical Activity: Not on file  Stress: Not on file  Social Connections: Not on file  Intimate Partner Violence: Not on file    Review of Systems: See HPI, otherwise negative ROS  Physical Exam: Vital signs in last 24 hours: Temp:  [98.6 F (37 C)] 98.6 F (37 C) (03/07 0927) Pulse Rate:  [83] 83 (03/07 0927) Resp:  [17] 17 (03/07 0927) BP: (129)/(93) 129/93 (03/07 0927) SpO2:  [99 %] 99 % (03/07 0927) Weight:  [70.3 kg] 70.3 kg (03/07 0916)   General:   Alert,  Well-developed, well-nourished, pleasant and cooperative in NAD Head:  Normocephalic and atraumatic. Eyes:  Sclera clear, no icterus.   Conjunctiva pink. Ears:  Normal auditory acuity. Nose:  No deformity, discharge,  or lesions. Mouth:  No deformity or lesions, dentition normal. Neck:  Supple; no masses or thyromegaly. Lungs:  Clear throughout to auscultation.   No wheezes, crackles, or rhonchi. No acute distress. Heart:  Regular rate and rhythm; no murmurs, clicks, rubs,  or gallops. Abdomen:  Soft, nontender and nondistended. No masses, hepatosplenomegaly or hernias noted. Normal bowel sounds, without guarding, and without rebound.   Msk:  Symmetrical without gross deformities. Normal posture.  Extremities:  Without clubbing or edema. Neurologic:  Alert and  oriented x4;  grossly normal neurologically. Skin:  Intact without significant lesions or rashes. Cervical Nodes:  No significant cervical adenopathy. Psych:  Alert and cooperative. Normal mood and affect.  Impression/Plan: Felicia Huff is here for a colonoscopy to be performed for surveillance due to personal history of adenomatous colon polyps.   The risks of the procedure including infection, bleed, or perforation as well as benefits, limitations, alternatives and imponderables have been reviewed with the patient. Questions have been answered. All parties agreeable.

## 2021-01-09 NOTE — Anesthesia Procedure Notes (Signed)
Date/Time: 01/09/2021 10:04 AM Performed by: Vista Deck, CRNA Pre-anesthesia Checklist: Patient identified, Emergency Drugs available, Suction available, Timeout performed and Patient being monitored Patient Re-evaluated:Patient Re-evaluated prior to induction Oxygen Delivery Method: Nasal Cannula

## 2021-01-09 NOTE — Anesthesia Preprocedure Evaluation (Addendum)
Anesthesia Evaluation  Patient identified by MRN, date of birth, ID band Patient awake    Reviewed: Allergy & Precautions, NPO status , Patient's Chart, lab work & pertinent test results  History of Anesthesia Complications Negative for: history of anesthetic complications  Airway Mallampati: I  TM Distance: >3 FB Neck ROM: Full    Dental  (+) Edentulous Upper, Edentulous Lower   Pulmonary Current SmokerPatient did not abstain from smoking.,    Pulmonary exam normal breath sounds clear to auscultation       Cardiovascular Exercise Tolerance: Good Normal cardiovascular exam Rhythm:Regular Rate:Normal     Neuro/Psych PSYCHIATRIC DISORDERS    GI/Hepatic negative GI ROS, Neg liver ROS,   Endo/Other  negative endocrine ROS  Renal/GU negative Renal ROS     Musculoskeletal negative musculoskeletal ROS (+)   Abdominal   Peds  Hematology negative hematology ROS (+)   Anesthesia Other Findings   Reproductive/Obstetrics negative OB ROS                            Anesthesia Physical Anesthesia Plan  ASA: II  Anesthesia Plan: General   Post-op Pain Management:    Induction: Intravenous  PONV Risk Score and Plan: TIVA and Propofol infusion  Airway Management Planned: Nasal Cannula and Natural Airway  Additional Equipment:   Intra-op Plan:   Post-operative Plan:   Informed Consent: I have reviewed the patients History and Physical, chart, labs and discussed the procedure including the risks, benefits and alternatives for the proposed anesthesia with the patient or authorized representative who has indicated his/her understanding and acceptance.       Plan Discussed with: CRNA and Surgeon  Anesthesia Plan Comments:        Anesthesia Quick Evaluation

## 2021-01-10 LAB — SURGICAL PATHOLOGY

## 2021-01-11 ENCOUNTER — Telehealth: Payer: Self-pay | Admitting: Internal Medicine

## 2021-01-11 NOTE — Telephone Encounter (Signed)
PATIENT CALLED ASKING IF HER BIOPSY RESULTS WERE COMPLETE

## 2021-01-11 NOTE — Telephone Encounter (Signed)
Phoned and LM on pt's vm that Dr. Abbey Chatters just received her results yesterday and he is not in the office today. He will return tomorrow and we will contact you with your results.

## 2021-01-12 ENCOUNTER — Other Ambulatory Visit: Payer: Self-pay | Admitting: *Deleted

## 2021-01-12 ENCOUNTER — Encounter (HOSPITAL_COMMUNITY): Payer: Self-pay | Admitting: Internal Medicine

## 2021-01-12 DIAGNOSIS — R933 Abnormal findings on diagnostic imaging of other parts of digestive tract: Secondary | ICD-10-CM

## 2021-01-12 NOTE — Telephone Encounter (Signed)
Dr. Abbey Chatters already discussed with the pt.

## 2021-01-16 ENCOUNTER — Telehealth: Payer: Self-pay

## 2021-01-16 NOTE — Telephone Encounter (Signed)
Pt called office and LMOVM, she hasn't been contacted to schedule surgery appt. Dr. Abbey Chatters had referred her last week.   Surgery referral was sent to Dr. Prentiss Bells. Arnoldo Morale 01/12/21.  Called pt, informed her referral was sent and it will take few days before they call her to schedule appt. Dr. Fransisca Kaufmann office is closed on Fridays and referral coordinator is off on Mondays.

## 2021-01-26 ENCOUNTER — Ambulatory Visit (INDEPENDENT_AMBULATORY_CARE_PROVIDER_SITE_OTHER): Payer: Medicaid Other | Admitting: General Surgery

## 2021-01-26 ENCOUNTER — Other Ambulatory Visit: Payer: Self-pay

## 2021-01-26 ENCOUNTER — Encounter: Payer: Self-pay | Admitting: General Surgery

## 2021-01-26 VITALS — BP 129/88 | HR 71 | Temp 98.3°F | Resp 12 | Ht 66.0 in | Wt 150.0 lb

## 2021-01-26 DIAGNOSIS — K6389 Other specified diseases of intestine: Secondary | ICD-10-CM

## 2021-01-26 MED ORDER — NEOMYCIN SULFATE 500 MG PO TABS: 1000.0000 mg | ORAL_TABLET | ORAL | 0 refills | Status: DC

## 2021-01-26 MED ORDER — METRONIDAZOLE 500 MG PO TABS: 1000.0000 mg | ORAL_TABLET | ORAL | 0 refills | Status: DC

## 2021-01-26 MED ORDER — DULCOLAX 5 MG PO TBEC: 20.0000 mg | DELAYED_RELEASE_TABLET | Freq: Once | ORAL | 0 refills | Status: AC

## 2021-01-26 NOTE — Progress Notes (Signed)
[facility] History and Physical  Reason for Referral: Abnormal colonoscopy, multiple polypoid lesions  Referring Physician:  Dr. Abbey Chatters   Chief Complaint    New Patient (Initial Visit)      Felicia Huff is a 50 y.o. female.  HPI:  Ms .Huff is a 50 yo who had a screening colonoscopy due to a personal history of polyps and had a prior colonoscopy with multiple polys removed 14 years prior.  She denied any hematochezia, melena or family history. She was seen by Dr. Abbey Chatters and there were two large polyps that could not be fully removed. One was in the transverse colon and the other in the descending colon per his report and both were marked distal to the lesion.   He felt like give the size and extent they should be removed with a colectomy. She is here today to discuss this procedure. She is otherwise well and has no complaints of abdominal pain. She had a tubal ligation and then a reversal in the past and small lower midline incision.   Past Medical History:  Diagnosis Date  . Colon polyps   . Decreased libido   . Family history of breast cancer     Past Surgical History:  Procedure Laterality Date  . BIOPSY  01/09/2021   Procedure: BIOPSY;  Surgeon: Eloise Harman, DO;  Location: [facility];  Service: Endoscopy;;  . CHOLECYSTECTOMY, LAPAROSCOPIC    . COLONOSCOPY WITH PROPOFOL N/A 01/09/2021   Procedure: COLONOSCOPY WITH PROPOFOL;  Surgeon: Eloise Harman, DO;  Location: [facility];  Service: Endoscopy;  Laterality: N/A;  ASA I / AM procedure (pt prefers later morning procedure if possible)  . POLYPECTOMY  01/09/2021   Procedure: POLYPECTOMY;  Surgeon: Eloise Harman, DO;  Location: [facility];  Service: Endoscopy;;  . tubal ligation reversal    . VAGINAL DELIVERY      Family History  Problem Relation Age of Onset  . Breast cancer Mother 63       cured  . Ovarian cancer Neg Hx     Social History   Tobacco Use  . Smoking status:  Current Every Day Smoker    Packs/day: 1.00    Types: Cigarettes  . Smokeless tobacco: Never Used  Vaping Use  . Vaping Use: Never used  Substance Use Topics  . Alcohol use: No  . Drug use: No    Medications: I have reviewed the patient's current medications. Allergies as of 01/26/2021   No Known Allergies     Medication List       Accurate as of January 26, 2021  3:56 PM. If you have any questions, ask your nurse or doctor.        STOP taking these medications   Suprep Bowel Prep Kit 17.5-3.13-1.6 GM/177ML Soln Generic drug: Na Sulfate-K Sulfate-Mg Sulf Stopped by: Felicia Huff     TAKE these medications   Dulcolax 5 MG EC tablet Generic drug: bisacodyl Take 4 tablets (20 mg total) by mouth once for 1 dose. Take at 7 AM the day prior to surgery. Started by: Felicia Huff   metroNIDAZOLE 500 MG tablet Commonly known as: FLAGYL Take 2 tablets (1,000 mg total) by mouth as directed. Take 2 flagyl 533m tablets at 2pm, 3pm and 10 pm. Started by: Felicia Cagey Huff   neomycin 500 MG tablet Commonly known as: MYCIFRADIN Take 2 tablets (1,000 mg total) by mouth as directed. Take 2  neomycin 580m tablets at 2pm, 3pm and 10 pm. Started by: Felicia Cagey Huff        ROS:  A comprehensive review of systems was negative.  Blood pressure 129/88, pulse 71, temperature 98.3 F (36.8 C), temperature source Other (Comment), resp. rate 12, height 5' 6"  (1.676 m), weight 150 lb (68 kg), SpO2 97 %. Physical Exam Vitals reviewed.  Constitutional:      Appearance: She is normal weight.  HENT:     Head: Normocephalic.     Nose: Nose normal.     Mouth/Throat:     Mouth: Mucous membranes are moist.  Eyes:     Extraocular Movements: Extraocular movements intact.  Cardiovascular:     Rate and Rhythm: Normal rate and regular rhythm.  Pulmonary:     Effort: Pulmonary effort is normal.     Breath sounds: Normal breath sounds.  Abdominal:     General: There  is no distension.     Palpations: Abdomen is soft.  Musculoskeletal:        General: Normal range of motion.     Cervical back: Normal range of motion.  Skin:    General: Skin is warm.  Neurological:     General: No focal deficit present.     Mental Status: She is alert and oriented to person, place, and time.  Psychiatric:        Mood and Affect: Mood normal.        Behavior: Behavior normal.        Thought Content: Thought content normal.        Judgment: Judgment normal.     Results: Colonoscopy Impression: 01/09/2021 Internal hemorrhoids. - Diverticulosis in the sigmoid colon. - Rule out malignancy, tumor in the transverse colon. Biopsied. Tattooed. - One 12 mm polyp in the transverse colon, removed with a cold snare. Resected and retrieved. - One 20 mm polyp in the descending colon. Biopsied. Tattooed.  Pathology: FINAL MICROSCOPIC DIAGNOSIS:   A. COLON, TRANSVERSE, BIOPSY:  - Tubular adenoma (5 of 5 fragments)  - No high grade dysplasia or malignancy identified  - See comment   B. COLON, TRANSVERSE, POLYPECTOMY:  - Tubular adenoma (2 of 2 fragments)  - No high grade dysplasia or malignancy identified   C. COLON, DESCENDING, POLYPECTOMY:  - Tubular adenoma (3 of 5 fragments)  - Benign colonic mucosa (2 of 5 fragments)  - No high grade dysplasia or malignancy identified   Assessment & Plan:  TTRINA ASCHis a 50y.o. female with multiple polyps in the transverse and descending  Colon with tattoos distally. She will need to undergo an elective left hemicolectomy.   We discussed laparoscopic versus open partial colectomy and the risk of bleeding, infection, anastomotic leak, injury to ureter or other organ, need for open procedure and post operative course. Discussed risk of finding cancer and needing additional treatment.   -Discussed preop COVID testing and Colon preparation. She had some issues with Dr. CLuvenia Reddenprep but did a laxative and the preparation  was sufficient. Discussed her trying out prep and otherwise doing what she did for the colonoscopy and still taking the antibiotics. Discussed the reasoning and safety issues with prepping appropriately.   Colon Preparation:   Buy from the Store: Miralax bottle [phone].  Gatorade 64 oz (not red). Dulcolax tablets.   The Day Prior to Surgery: Take 4 ducolax tablets at 7am with water. Drink plenty of clear liquids all day to avoid dehydration, no solid food.  Mix the bottle of Miralax and 64 oz of Gatorade and drink this mixture starting at 10am. Drink it gradually over the next few hours, 8 ounces every 15-30 minutes until it is gone. Finish this by 2pm.  Take 2 neomycin 52m tablets and 2 metronidazole 5035mtablets at 2 pm. Take 2 neomycin 50083mablets and 2 metronidazole 500m14mblets at 3pm. Take 2 neomycin 500mg38mlets and 2 metronidazole 500mg 41mets at 10pm.    Do not eat or drink anything after midnight the night before your surgery.  Do not eat or drink anything that morning, and take medications as instructed by the hospital staff on your preoperative visit.   Discussed blood use and she has consented to getting blood.   All questions were answered to the satisfaction of the patient and family.    LindsaVirl [email or web address], 3:56 PM

## 2021-01-26 NOTE — Patient Instructions (Addendum)
Buy from the Store: Miralax bottle [phone]).  Gatorade 64 oz (not red). Dulcolax tablets.   The Day Prior to Surgery: Take 4 ducolax tablets at 7am with water. Drink plenty of clear liquids all day to avoid dehydration, no solid food.    Mix the bottle of Miralax and 64 oz of Gatorade and drink this mixture starting at 10am. Drink it gradually over the next few hours, 8 ounces every 15-30 minutes until it is gone. Finish this by 2pm.  Take 2 neomycin 500mg  tablets and 2 metronidazole 500mg  tablets at 2 pm. Take 2 neomycin 500mg  tablets and 2 metronidazole 500mg  tablets at 3pm. Take 2 neomycin 500mg  tablets and 2 metronidazole 500mg  tablets at 10pm.    Do not eat or drink anything after midnight the night before your surgery.  Do not eat or drink anything that morning, and take medications as instructed by the hospital staff on your preoperative visit.    Open Colectomy An open total colectomy is surgery to remove all of the large intestine (colon). This procedure may be used to treat several conditions, including:  Tumors or masses in the colon.  Inflammatory bowel disease, such as Crohn's disease or ulcerative colitis.  Bleeding from the colon.  Infection throughout the colon (diffuse infection) caused by Clostridioides difficile. This type of bacteria is also called C. difficile or C. diff. Tell a health care provider about:  Any allergies you have.  All medicines you are taking, including vitamins, herbs, eye drops, creams, and over-the-counter medicines.  Any problems you or family members have had with anesthetic medicines.  Any blood disorders you have.  Any surgeries you have had.  Any medical conditions you have.  Whether you are pregnant or may be pregnant.  Whether you use tobacco products. These can affect your body's ability to recover and heal. What are the risks? Generally, this is a safe procedure. However, problems may occur,  including:  Infection.  Bleeding.  Allergic reactions to medicines.  Damage to nearby structures or organs.  The incision opening up.  Tissues from inside your abdomen bulging through the incision (hernia).  A blood clot forming in a vein and traveling to the lungs. Medicines Ask your health care provider about:  Changing or stopping your regular medicines or vitamins. This is especially important if you are taking diabetes medicines or blood thinners.  Taking medicines such as aspirin and ibuprofen. These medicines can thin your blood. Do not take these medicines unless your health care provider tells you to take them.  Taking over-the-counter medicines, vitamins, herbs, and supplements. Tests You may need tests, such as:  Blood tests.  Electrocardiogram (ECG). This is a test to check the heart's rhythm.  CT scan of your abdomen.  Urine tests.  Colonoscopy. General instructions  Do not use any products that contain nicotine or tobacco for at least 4 weeks before the procedure. These products include cigarettes, e-cigarettes, and chewing tobacco. If you need help quitting, ask your health care provider.  You may be prescribed an oral bowel prep to clean out your colon. If so: ? Take it as told by your health care provider. Starting the day before your procedure, you may need to drink a large amount of liquid with medicine in it. The liquid will cause you to have bowel movements with multiple loose stools until your stool is almost clear or light green. ? Follow instructions from your health care provider about eating and drinking restrictions during bowel prep.  Plan to have someone take you home from the hospital or clinic.  Arrange for someone to help you with your activities during your recovery.  Bring loose-fitting, comfortable clothing and slip-on shoes that you can put on without bending over.  Ask your health care provider what steps will be taken to help  prevent infection. These may include: ? Removing hair at the surgery site. ? Washing skin with a germ-killing soap. ? Taking antibiotic medicine. What happens during the procedure?  An IV will be inserted into one of your veins.  You will be given one or more of the following: ? A medicine to help you relax (sedative). ? A medicine to make you fall asleep (general anesthetic).  Small monitors will be connected to your body. They will be used to check your heart, blood pressure, and oxygen level.  A breathing tube may be placed into your lungs.  A thin, flexible tube (catheter) will be placed into your bladder to drain urine.  A tube may be inserted through your nose and into your stomach (nasogastric tube, or NG tube). The tube is used to remove stomach fluids after surgery until the intestines start working again.  An incision will be made in your abdomen.  The entire colon will be removed. The rectum will stay in place and will be closed with stitches (sutures) or staples.  Then, you will have one of the following two procedures: ? Ileostomy. This brings the end of the small intestine, or ileum, up to the skin of your abdomen. A small opening, called a stoma, will be created in your lower abdomen. A removable, external pouch (ostomy pouch) will be attached to the stoma. This pouch will collect stool outside of your body. Stool will pass through the stoma and into the pouch instead of through the opening between the buttocks (anus). ? Ileorectal anastomosis.This connects the ileum to the rectum.  The incision in your abdomen will be closed with sutures, staples, or adhesive strips.  The incision will be covered with a bandage (dressing). The procedure may vary among health care providers and hospitals. What happens after the procedure?  Your blood pressure, heart rate, breathing rate, and blood oxygen level will be monitored until you leave the hospital.  You may continue to  receive fluids and medicines through an IV.  You will start on a clear liquid diet and gradually go back to a normal diet.  Do not drive until your health care provider approves.  You may have some pain in your abdomen. You will be given medicine to control the pain.  You will be encouraged to: ? Do breathing exercises to prevent pneumonia. ? Get up and start walking within a day after surgery. You should try to get up 5-6 times a day. Summary  An open total colectomy is surgery to remove all of the large intestine (colon).  Follow instructions from your health care provider about eating and drinking before your procedure.  You may be prescribed an oral bowel prep to clean out your colon before your procedure. If so, follow your health care provider's instructions for taking it.  Arrange for someone to help you with your activities during your recovery. This information is not intended to replace advice given to you by your health care provider. Make sure you discuss any questions you have with your health care provider. Document Revised: 09/30/2019 Document Reviewed: 09/30/2019 Elsevier Patient Education  2021 [employer].

## 2021-01-30 ENCOUNTER — Telehealth: Payer: Self-pay | Admitting: Family Medicine

## 2021-01-30 DIAGNOSIS — K6389 Other specified diseases of intestine: Secondary | ICD-10-CM | POA: Insufficient documentation

## 2021-01-30 NOTE — H&P (Signed)
[facility] History and Physical  Reason for Referral: Abnormal colonoscopy, multiple polypoid lesions  Referring Physician:  Dr. Abbey Huff   Chief Complaint    New Patient (Initial Visit)      Felicia Huff is a 50 y.o. female.  HPI:  Felicia Huff is a 50 yo who had a screening colonoscopy due to a personal history of polyps and had a prior colonoscopy with multiple polys removed 14 years prior.  Felicia Huff denied any hematochezia, melena or family history. Felicia Huff was seen by Dr. Abbey Huff and there were two large polyps that could not be fully removed. One was in the transverse colon and the other in the descending colon per his report and both were marked distal to the lesion.   He felt like give the size and extent they should be removed with a colectomy. Felicia Huff is here today to discuss this procedure. Felicia Huff is otherwise well and has no complaints of abdominal pain. Felicia Huff had a tubal ligation and then a reversal in the past and small lower midline incision.   Past Medical History:  Diagnosis Date  . Colon polyps   . Decreased libido   . Family history of breast cancer     Past Surgical History:  Procedure Laterality Date  . BIOPSY  01/09/2021   Procedure: BIOPSY;  Surgeon: Eloise Harman, DO;  [facility]: [facility];  Service: Endoscopy;;  . CHOLECYSTECTOMY, LAPAROSCOPIC    . COLONOSCOPY WITH PROPOFOL N/A 01/09/2021   Procedure: COLONOSCOPY WITH PROPOFOL;  Surgeon: Eloise Harman, DO;  Location: [facility];  Service: Endoscopy;  Laterality: N/A;  ASA I / AM procedure (pt prefers later morning procedure if possible)  . POLYPECTOMY  01/09/2021   Procedure: POLYPECTOMY;  Surgeon: Eloise Harman, DO;  [facility]: [facility];  Service: Endoscopy;;  . tubal ligation reversal    . VAGINAL DELIVERY      Family History  Problem Relation Age of Onset  . Breast cancer Mother 67       cured  . Ovarian cancer Neg Hx     Social History   Tobacco Use  . Smoking status:  Current Every Day Smoker    Packs/day: 1.00    Types: Cigarettes  . Smokeless tobacco: Never Used  Vaping Use  . Vaping Use: Never used  Substance Use Topics  . Alcohol use: No  . Drug use: No    Medications: I have reviewed the patient's current medications. Allergies as of 01/26/2021   No Known Allergies     Medication List       Accurate as of January 26, 2021  3:56 PM. If you have any questions, ask your nurse or doctor.        STOP taking these medications   Suprep Bowel Prep Kit 17.5-3.13-1.6 GM/177ML Soln Generic drug: Na Sulfate-K Sulfate-Mg Sulf Stopped by: Virl Cagey, MD     TAKE these medications   Dulcolax 5 MG EC tablet Generic drug: bisacodyl Take 4 tablets (20 mg total) by mouth once for 1 dose. Take at 7 AM the day prior to surgery. Started by: Virl Cagey, MD   metroNIDAZOLE 500 MG tablet Commonly known as: FLAGYL Take 2 tablets (1,000 mg total) by mouth as directed. Take 2 flagyl 552m tablets at 2pm, 3pm and 10 pm. Started by: LVirl Cagey MD   neomycin 500 MG tablet Commonly known as: MYCIFRADIN Take 2 tablets (1,000 mg total) by mouth as directed. Take 2  neomycin 540m tablets at 2pm, 3pm and 10 pm. Started by: LVirl Cagey MD        ROS:  A comprehensive review of systems was negative.  Blood pressure 129/88, pulse 71, temperature 98.3 F (36.8 C), temperature source Other (Comment), resp. rate 12, height 5' 6"  (1.676 m), weight 150 lb (68 kg), SpO2 97 %. Physical Exam Vitals reviewed.  Constitutional:      Appearance: Felicia Huff is normal weight.  HENT:     Head: Normocephalic.     Nose: Nose normal.     Mouth/Throat:     Mouth: Mucous membranes are moist.  Eyes:     Extraocular Movements: Extraocular movements intact.  Cardiovascular:     Rate and Rhythm: Normal rate and regular rhythm.  Pulmonary:     Effort: Pulmonary effort is normal.     Breath sounds: Normal breath sounds.  Abdominal:     General: There  is no distension.     Palpations: Abdomen is soft.  Musculoskeletal:        General: Normal range of motion.     Cervical back: Normal range of motion.  Skin:    General: Skin is warm.  Neurological:     General: No focal deficit present.     Mental Status: Felicia Huff is alert and oriented to person, place, and time.  Psychiatric:        Mood and Affect: Mood normal.        Behavior: Behavior normal.        Thought Content: Thought content normal.        Judgment: Judgment normal.     Results: Colonoscopy Impression: 01/09/2021 Internal hemorrhoids. - Diverticulosis in the sigmoid colon. - Rule out malignancy, tumor in the transverse colon. Biopsied. Tattooed. - One 12 mm polyp in the transverse colon, removed with a cold snare. Resected and retrieved. - One 20 mm polyp in the descending colon. Biopsied. Tattooed.  Pathology: FINAL MICROSCOPIC DIAGNOSIS:   A. COLON, TRANSVERSE, BIOPSY:  - Tubular adenoma (5 of 5 fragments)  - No high grade dysplasia or malignancy identified  - See comment   B. COLON, TRANSVERSE, POLYPECTOMY:  - Tubular adenoma (2 of 2 fragments)  - No high grade dysplasia or malignancy identified   C. COLON, DESCENDING, POLYPECTOMY:  - Tubular adenoma (3 of 5 fragments)  - Benign colonic mucosa (2 of 5 fragments)  - No high grade dysplasia or malignancy identified   Assessment & Plan:  TMARSHAY SLATESis a 50y.o. female with multiple polyps in the transverse and descending  Colon with tattoos distally. Felicia Huff will need to undergo an elective left hemicolectomy.   We discussed laparoscopic versus open partial colectomy and the risk of bleeding, infection, anastomotic leak, injury to ureter or other organ, need for open procedure and post operative course. Discussed risk of finding cancer and needing additional treatment.   -Discussed preop COVID testing and Colon preparation. Felicia Huff had some issues with Dr. CLuvenia Reddenprep but did a laxative and the preparation  was sufficient. Discussed her trying out prep and otherwise doing what Felicia Huff did for the colonoscopy and still taking the antibiotics. Discussed the reasoning and safety issues with prepping appropriately.   Colon Preparation:   Buy from the Store: Miralax bottle [phone].  Gatorade 64 oz (not red). Dulcolax tablets.   The Day Prior to Surgery: Take 4 ducolax tablets at 7am with water. Drink plenty of clear liquids all day to avoid dehydration, no solid food.  Mix the bottle of Miralax and 64 oz of Gatorade and drink this mixture starting at 10am. Drink it gradually over the next few hours, 8 ounces every 15-30 minutes until it is gone. Finish this by 2pm.  Take 2 neomycin 547m tablets and 2 metronidazole 5093mtablets at 2 pm. Take 2 neomycin 50021mablets and 2 metronidazole 500m35mblets at 3pm. Take 2 neomycin 500mg26mlets and 2 metronidazole 500mg 6mets at 10pm.    Do not eat or drink anything after midnight the night before your surgery.  Do not eat or drink anything that morning, and take medications as instructed by the hospital staff on your preoperative visit.   Discussed blood use and Felicia Huff has consented to getting blood.   All questions were answered to the satisfaction of the patient and family.    LindsaVirl Cagey2022, 3:56 PM

## 2021-01-30 NOTE — Telephone Encounter (Signed)
PA Submitted through [email or web address] and received the following:  [employer] is reviewing your PA request. Typically an electronic response will be received within 24-72 hours. To check for an update later, open this request from your dashboard.  Medication on [email or web address] is less then $10 if patient needs to pay for it out of pocket.

## 2021-01-31 MED ORDER — NEOMYCIN SULFATE 500 MG PO TABS: 1000.0000 mg | ORAL_TABLET | ORAL | 0 refills | Status: DC

## 2021-01-31 NOTE — Telephone Encounter (Signed)
Approvedon March 28 Request Reference Number: [id number]. NEOMYCIN TAB 500MG  is approved through 01/30/2022. For further questions, call [employer] at [phone].  RX resent - Pharm made aware

## 2021-02-01 ENCOUNTER — Ambulatory Visit (HOSPITAL_COMMUNITY)
Admission: RE | Admit: 2021-02-01 | Discharge: 2021-02-01 | Disposition: A | Discharge status: Home / Self Care | Payer: Medicaid Other | Source: Ambulatory Visit | Attending: Family Medicine | Admitting: Family Medicine

## 2021-02-01 ENCOUNTER — Telehealth: Payer: Self-pay | Admitting: Internal Medicine

## 2021-02-01 ENCOUNTER — Telehealth: Payer: Self-pay

## 2021-02-01 DIAGNOSIS — Z1231 Encounter for screening mammogram for malignant neoplasm of breast: Secondary | ICD-10-CM | POA: Insufficient documentation

## 2021-02-01 NOTE — Telephone Encounter (Signed)
Pt's PCP is asking when her next colonoscopy is due. Please advise.

## 2021-02-01 NOTE — Telephone Encounter (Signed)
Pt calling; has had period for 3wks now; first week was light; has been like peeing blood this week.  [phone]  Pt states is saturating pad q17min-1hr.  Adv per policy to go to ED.

## 2021-02-02 NOTE — Telephone Encounter (Signed)
Phoned and LM on vm that her next colonoscopy isn't due until the year 2032.

## 2021-02-03 NOTE — Patient Instructions (Addendum)
Felicia Huff  02/03/2021     @PREFPERIOPPHARMACY @   Your procedure is scheduled on  02/08/2021.   Report to [facility] at  0800  A.M.   Call this number if you have problems the morning of surgery:  [phone]   Remember:  Follow the diet and prep instructions given to you by Dr Constance Haw. Drink 2 of your carb drinks the night before your procedure, before bed.  Drink 1 bottle of your carb drink at 0530 am and then nothing else to drink after that.  You may drink clear liquids until 0530 am when you drink your carb drink.                          Take these medicines the morning of surgery with A SIP OF WATER  None   Place clean sheets on your bed the night before your procedure and DO NOT sleep with pets this night.    Shower with CHG the night before and the morning of your procedure. DO NOT put CHG on your face, hair or genitals.  After each shower, dry off with a clean towel, put on clean, comfortable clothes and brush your teeth.       Do not wear jewelry, make-up or nail polish.  Do not wear lotions, powders, or perfumes, or deodorant.  Do not shave 48 hours prior to surgery.  Men may shave face and neck.  Do not bring valuables to the hospital.  [facility] is not responsible for any belongings or valuables.    Contacts, dentures or bridgework may not be worn into surgery.  Leave your suitcase in the car.  After surgery it may be brought to your room.  For patients admitted to the hospital, discharge time will be determined by your treatment team.  Patients discharged the day of surgery will not be allowed to drive home and must have someone with them for 24 hours.     Special instructions:  DO NOT smoke tobacco or vape the morning of your procedure.    Please read over the following fact sheets that you were given. Coughing and Deep Breathing, Surgical Site Infection Prevention, Anesthesia Post-op Instructions and Care and Recovery After  Surgery       Minimally Invasive Total Colectomy, Care After This sheet gives you information about how to care for yourself after your procedure. Your health care provider may also give you more specific instructions. If you have problems or questions, contact your health care provider. What can I expect after the procedure? After your procedure, it is common to have the following:  Pain in your abdomen, especially in the incision areas. You will be given medicine to control the pain.  Tiredness. This is a normal part of the recovery process. Your energy level will return to normal over the next several weeks.  Changes in your bowel movements, especially having bowel movements more often. Talk with your health care provider about how to manage this. Follow these instructions at home: Medicines  Take over-the-counter and prescription medicines only as told by your health care provider.  If you were prescribed an antibiotic medicine, take it as told by your health care provider. Do not stop using the antibiotic even if you start to feel better.  Ask your health care provider if the medicine prescribed to you requires you to avoid driving or using machinery. Eating and  drinking  Follow instructions from your health care provider about what you can eat after surgery. You may be asked to begin with a diet that is low in fiber.  Do not drink alcohol if: ? Your health care provider tells you not to drink. ? You are pregnant, may be pregnant, or are planning to become pregnant.  If you drink alcohol: ? Limit how much you use to:  0-1 drink a day for women.  0-2 drinks a day for men. ? Be aware of how much alcohol is in your drink. In the [place] one drink equals one 12 oz bottle of beer (355 mL), one 5 oz glass of wine (148 mL), or one 1 oz glass of hard liquor (44 mL). Incision care  Follow instructions from your health care provider about how to take care of your incisions. Make  sure you: ? Wash your hands with soap and water for at least 20 seconds before and after applying medicine to the area, and before and after changing your bandage (dressing). If soap and water are not available, use hand sanitizer. ? Change your dressing as told by your health care provider. ? Leave stitches (sutures) or staples in place. These skin closures may need to stay in place for 2 weeks or longer. If adhesive strip edges start to loosen and curl up, you may trim the loose edges. Do not remove adhesive strips completely unless your health care provider tells you to do that.  Keep your incisions clean and dry.  Do not wear tight clothing over the incisions. Tight clothing may rub and irritate the incision areas, which may cause the incisions to open.  Do not take baths, swim, or use a hot tub until your health care provider approves. Ask your health care provider if you may take showers. You may only be allowed to take sponge baths.  Check your incision area every day for signs of infection. Check for: ? More redness, swelling, or pain. ? Fluid or blood. ? Warmth. ? Pus or a bad smell.   Activity  Rest as told by your health care provider.  Avoid sitting for a long time without moving. Get up to take short walks every 1-2 hours. This is important to improve blood flow and breathing. Ask for help if you feel weak or unsteady.  Do not lift anything that is heavier than 10 lb (4.5 kg), or the limit that you are told, until your health care provider says that it is safe.  Return to your normal activities as told by your health care provider. Ask your health care provider what activities are safe for you. General instructions  Keep all follow-up visits as told by your health care provider. This is important. You may need a follow-up visit to remove sutures or staples. Contact a health care provider if:  You have more redness, swelling, or pain around your incisions.  You have fluid  or blood coming from the incisions.  Your incisions feel warm to the touch.  You have pus or a bad smell coming from your incisions or your dressing.  You have a fever.  Your incisions break open after sutures or staples have been removed. Get help right away if:  You develop a rash.  You have chest pain or difficulty breathing.  You have pain or swelling in your legs.  You feel light-headed or you faint.  Your abdomen swells (becomes distended).  You have nausea or vomiting.  You have  blood in your stool. Summary  After the procedure, it is common to have pain in the abdomen, tiredness, or changes in bowel movements.  Follow instructions from your health care provider about how to care for your incisions.  Begin your diet with low-fiber foods. Your health care provider will tell you when to resume your regular diet.  Contact a health care provider if you have any signs of infection, such as a fever, more redness, swelling, or pain around your incisions, or a bad smell coming from your incisions.  Get help right away if you have a rash, nausea, chest pain, pain or swelling in your legs, or blood in your stool. This information is not intended to replace advice given to you by your health care provider. Make sure you discuss any questions you have with your health care provider. Document Revised: 09/30/2019 Document Reviewed: 09/30/2019 Elsevier Patient Education  2021 [place] Anesthesia, Adult, Care After This sheet gives you information about how to care for yourself after your procedure. Your health care provider may also give you more specific instructions. If you have problems or questions, contact your health care provider. What can I expect after the procedure? After the procedure, the following side effects are common:  Pain or discomfort at the IV site.  Nausea.  Vomiting.  Sore throat.  Trouble concentrating.  Feeling cold or  chills.  Feeling weak or tired.  Sleepiness and fatigue.  Soreness and body aches. These side effects can affect parts of the body that were not involved in surgery. Follow these instructions at home: For the time period you were told by your health care provider:  Rest.  Do not participate in activities where you could fall or become injured.  Do not drive or use machinery.  Do not drink alcohol.  Do not take sleeping pills or medicines that cause drowsiness.  Do not make important decisions or sign legal documents.  Do not take care of children on your own.   Eating and drinking  Follow any instructions from your health care provider about eating or drinking restrictions.  When you feel hungry, start by eating small amounts of foods that are soft and easy to digest (bland), such as toast. Gradually return to your regular diet.  Drink enough fluid to keep your urine pale yellow.  If you vomit, rehydrate by drinking water, juice, or clear broth. General instructions  If you have sleep apnea, surgery and certain medicines can increase your risk for breathing problems. Follow instructions from your health care provider about wearing your sleep device: ? Anytime you are sleeping, including during daytime naps. ? While taking prescription pain medicines, sleeping medicines, or medicines that make you drowsy.  Have a responsible adult stay with you for the time you are told. It is important to have someone help care for you until you are awake and alert.  Return to your normal activities as told by your health care provider. Ask your health care provider what activities are safe for you.  Take over-the-counter and prescription medicines only as told by your health care provider.  If you smoke, do not smoke without supervision.  Keep all follow-up visits as told by your health care provider. This is important. Contact a health care provider if:  You have nausea or vomiting  that does not get better with medicine.  You cannot eat or drink without vomiting.  You have pain that does not get better with medicine.  You are unable to pass urine.  You develop a skin rash.  You have a fever.  You have redness around your IV site that gets worse. Get help right away if:  You have difficulty breathing.  You have chest pain.  You have blood in your urine or stool, or you vomit blood. Summary  After the procedure, it is common to have a sore throat or nausea. It is also common to feel tired.  Have a responsible adult stay with you for the time you are told. It is important to have someone help care for you until you are awake and alert.  When you feel hungry, start by eating small amounts of foods that are soft and easy to digest (bland), such as toast. Gradually return to your regular diet.  Drink enough fluid to keep your urine pale yellow.  Return to your normal activities as told by your health care provider. Ask your health care provider what activities are safe for you. This information is not intended to replace advice given to you by your health care provider. Make sure you discuss any questions you have with your health care provider. Document Revised: 07/07/2020 Document Reviewed: 02/04/2020 Elsevier Patient Education  2021 [employer].

## 2021-02-06 ENCOUNTER — Other Ambulatory Visit: Payer: Self-pay

## 2021-02-06 ENCOUNTER — Other Ambulatory Visit (HOSPITAL_COMMUNITY)
Admission: RE | Admit: 2021-02-06 | Discharge: 2021-02-06 | Disposition: A | Discharge status: Home / Self Care | Payer: Medicaid Other | Source: Ambulatory Visit | Attending: General Surgery | Admitting: General Surgery

## 2021-02-06 ENCOUNTER — Encounter (HOSPITAL_COMMUNITY): Payer: Self-pay

## 2021-02-06 ENCOUNTER — Encounter (HOSPITAL_COMMUNITY)
Admission: RE | Admit: 2021-02-06 | Discharge: 2021-02-06 | Disposition: A | Discharge status: Home / Self Care | Payer: Medicaid Other | Source: Ambulatory Visit | Attending: General Surgery | Admitting: General Surgery

## 2021-02-06 DIAGNOSIS — Z20822 Contact with and (suspected) exposure to covid-19: Secondary | ICD-10-CM | POA: Insufficient documentation

## 2021-02-06 DIAGNOSIS — Z01812 Encounter for preprocedural laboratory examination: Secondary | ICD-10-CM | POA: Diagnosis not present

## 2021-02-06 LAB — COMPREHENSIVE METABOLIC PANEL
ALT: 10 U/L (ref 0–44)
AST: 15 U/L (ref 15–41)
Albumin: 3.8 g/dL (ref 3.5–5.0)
Alkaline Phosphatase: 88 U/L (ref 38–126)
Anion gap: 9 (ref 5–15)
BUN: 11 mg/dL (ref 6–20)
CO2: 23 mmol/L (ref 22–32)
Calcium: 8.3 mg/dL — ABNORMAL LOW (ref 8.9–10.3)
Chloride: 101 mmol/L (ref 98–111)
Creatinine, Ser: 0.71 mg/dL (ref 0.44–1.00)
GFR, Estimated: >60 mL/min (ref >60)
Glucose, Bld: 77 mg/dL (ref 70–99)
Potassium: 3.1 mmol/L — ABNORMAL LOW (ref 3.5–5.1)
Sodium: 133 mmol/L — ABNORMAL LOW (ref 135–145)
Total Bilirubin: 0.5 mg/dL (ref 0.3–1.2)
Total Protein: 7.3 g/dL (ref 6.5–8.1)

## 2021-02-06 LAB — HCG, QUANTITATIVE, PREGNANCY: hCG, Beta Chain, Quant, S: <1 m[IU]/mL (ref <5)

## 2021-02-06 LAB — HEMOGLOBIN A1C
Hgb A1c MFr Bld: 5.6 % (ref 4.8–5.6)
Mean Plasma Glucose: 114.02 mg/dL

## 2021-02-06 LAB — PREPARE RBC (CROSSMATCH)

## 2021-02-07 LAB — SARS CORONAVIRUS 2 (TAT 6-24 HRS): SARS Coronavirus 2: NEGATIVE

## 2021-02-07 LAB — CEA: CEA: 0.8 ng/mL (ref 0.0–4.7)

## 2021-02-08 ENCOUNTER — Encounter (HOSPITAL_COMMUNITY): Payer: Self-pay | Admitting: General Surgery

## 2021-02-08 ENCOUNTER — Encounter (HOSPITAL_COMMUNITY)
Admission: RE | Disposition: A | Discharge status: Home / Self Care | Payer: Self-pay | Source: Home / Self Care | Attending: General Surgery

## 2021-02-08 ENCOUNTER — Inpatient Hospital Stay (HOSPITAL_COMMUNITY)
Admission: RE | Admit: 2021-02-08 | Discharge: 2021-02-11 | DRG: 331 | Disposition: A | Discharge status: Home / Self Care | Payer: Medicaid Other | Attending: General Surgery | Admitting: General Surgery

## 2021-02-08 ENCOUNTER — Other Ambulatory Visit: Payer: Self-pay

## 2021-02-08 ENCOUNTER — Inpatient Hospital Stay (HOSPITAL_COMMUNITY): Payer: Medicaid Other | Admitting: Certified Registered"

## 2021-02-08 DIAGNOSIS — F17210 Nicotine dependence, cigarettes, uncomplicated: Secondary | ICD-10-CM | POA: Diagnosis present

## 2021-02-08 DIAGNOSIS — D124 Benign neoplasm of descending colon: Principal | ICD-10-CM | POA: Diagnosis present

## 2021-02-08 DIAGNOSIS — K6389 Other specified diseases of intestine: Secondary | ICD-10-CM | POA: Diagnosis not present

## 2021-02-08 HISTORY — PX: LAPAROSCOPIC PARTIAL COLECTOMY: SHX5907

## 2021-02-08 LAB — ABO/RH: ABO/RH(D): A POS

## 2021-02-08 SURGERY — LAPAROSCOPIC PARTIAL COLECTOMY
Anesthesia: General

## 2021-02-08 MED ORDER — DIPHENHYDRAMINE HCL 12.5 MG/5ML PO ELIX: 12.5000 mg | ORAL_SOLUTION | Freq: Four times a day (QID) | ORAL | Status: DC | PRN

## 2021-02-08 MED ORDER — FENTANYL CITRATE (PF) 250 MCG/5ML IJ SOLN
INTRAMUSCULAR | Status: AC
  Filled 2021-02-08: qty 5

## 2021-02-08 MED ORDER — HYDROMORPHONE HCL 1 MG/ML IJ SOLN
0.2500 mg | INTRAMUSCULAR | Status: DC | PRN
  Administered 2021-02-08: 0.5 mg via INTRAVENOUS
  Filled 2021-02-08: qty 0.5

## 2021-02-08 MED ORDER — MIDAZOLAM HCL 5 MG/5ML IJ SOLN
INTRAMUSCULAR | Status: DC | PRN
  Administered 2021-02-08: 2 mg via INTRAVENOUS

## 2021-02-08 MED ORDER — CHLORHEXIDINE GLUCONATE CLOTH 2 % EX PADS: 6.0000 | MEDICATED_PAD | Freq: Once | CUTANEOUS | Status: DC

## 2021-02-08 MED ORDER — DEXAMETHASONE SODIUM PHOSPHATE 10 MG/ML IJ SOLN
INTRAMUSCULAR | Status: DC | PRN
  Administered 2021-02-08: 8 mg via INTRAVENOUS

## 2021-02-08 MED ORDER — DIPHENHYDRAMINE HCL 50 MG/ML IJ SOLN: 12.5000 mg | Freq: Four times a day (QID) | INTRAMUSCULAR | Status: DC | PRN

## 2021-02-08 MED ORDER — ENSURE SURGERY PO LIQD
237.0000 mL | Freq: Two times a day (BID) | ORAL | Status: DC
  Administered 2021-02-08 – 2021-02-10 (×3): 237 mL via ORAL

## 2021-02-08 MED ORDER — EPHEDRINE 5 MG/ML INJ
INTRAVENOUS | Status: AC
  Filled 2021-02-08: qty 10

## 2021-02-08 MED ORDER — ACETAMINOPHEN 500 MG PO TABS
1000.0000 mg | ORAL_TABLET | ORAL | Status: AC
  Administered 2021-02-08: 1000 mg via ORAL

## 2021-02-08 MED ORDER — ONDANSETRON HCL 4 MG/2ML IJ SOLN
INTRAMUSCULAR | Status: AC
  Filled 2021-02-08: qty 2

## 2021-02-08 MED ORDER — MIDAZOLAM HCL 2 MG/2ML IJ SOLN
INTRAMUSCULAR | Status: AC
  Filled 2021-02-08: qty 2

## 2021-02-08 MED ORDER — BUPIVACAINE LIPOSOME 1.3 % IJ SUSP
INTRAMUSCULAR | Status: AC
  Filled 2021-02-08: qty 20

## 2021-02-08 MED ORDER — ENSURE PRE-SURGERY PO LIQD: 296.0000 mL | Freq: Once | ORAL | Status: DC

## 2021-02-08 MED ORDER — CHLORHEXIDINE GLUCONATE CLOTH 2 % EX PADS
6.0000 | MEDICATED_PAD | Freq: Every day | CUTANEOUS | Status: DC
  Administered 2021-02-10: 6 via TOPICAL

## 2021-02-08 MED ORDER — ENSURE PRE-SURGERY PO LIQD: 592.0000 mL | Freq: Once | ORAL | Status: DC

## 2021-02-08 MED ORDER — ROCURONIUM BROMIDE 10 MG/ML (PF) SYRINGE
PREFILLED_SYRINGE | INTRAVENOUS | Status: AC
  Filled 2021-02-08: qty 10

## 2021-02-08 MED ORDER — EPHEDRINE SULFATE-NACL 50-0.9 MG/10ML-% IV SOSY
PREFILLED_SYRINGE | INTRAVENOUS | Status: DC | PRN
  Administered 2021-02-08: 10 mg via INTRAVENOUS

## 2021-02-08 MED ORDER — ENOXAPARIN SODIUM 40 MG/0.4ML ~~LOC~~ SOLN
SUBCUTANEOUS | Status: AC
  Filled 2021-02-08: qty 0.4

## 2021-02-08 MED ORDER — ZOLPIDEM TARTRATE 5 MG PO TABS
5.0000 mg | ORAL_TABLET | Freq: Every evening | ORAL | Status: DC | PRN
Start: 2021-02-08 — End: 2021-02-11

## 2021-02-08 MED ORDER — SODIUM CHLORIDE 0.9 % IV SOLN
2.0000 g | INTRAVENOUS | Status: AC
  Administered 2021-02-08: 2 g via INTRAVENOUS
  Filled 2021-02-08: qty 2

## 2021-02-08 MED ORDER — METOCLOPRAMIDE HCL 5 MG/ML IJ SOLN: 10.0000 mg | Freq: Four times a day (QID) | INTRAMUSCULAR | Status: DC | PRN

## 2021-02-08 MED ORDER — ALUM & MAG HYDROXIDE-SIMETH 200-200-20 MG/5ML PO SUSP: 30.0000 mL | Freq: Four times a day (QID) | ORAL | Status: DC | PRN

## 2021-02-08 MED ORDER — ONDANSETRON HCL 4 MG/2ML IJ SOLN: 4.0000 mg | Freq: Once | INTRAMUSCULAR | Status: DC | PRN

## 2021-02-08 MED ORDER — LIDOCAINE HCL (PF) 2 % IJ SOLN
INTRAMUSCULAR | Status: AC
  Filled 2021-02-08: qty 5

## 2021-02-08 MED ORDER — ONDANSETRON HCL 4 MG/2ML IJ SOLN
INTRAMUSCULAR | Status: DC | PRN
  Administered 2021-02-08: 4 mg via INTRAVENOUS

## 2021-02-08 MED ORDER — ACETAMINOPHEN 500 MG PO TABS
ORAL_TABLET | ORAL | Status: AC
  Filled 2021-02-08: qty 2

## 2021-02-08 MED ORDER — SUGAMMADEX SODIUM 200 MG/2ML IV SOLN
INTRAVENOUS | Status: DC | PRN
  Administered 2021-02-08: 200 mg via INTRAVENOUS

## 2021-02-08 MED ORDER — METOPROLOL TARTRATE 5 MG/5ML IV SOLN: 5.0000 mg | Freq: Four times a day (QID) | INTRAVENOUS | Status: DC | PRN

## 2021-02-08 MED ORDER — 0.9 % SODIUM CHLORIDE (POUR BTL) OPTIME
TOPICAL | Status: DC | PRN
  Administered 2021-02-08 (×3): 1000 mL

## 2021-02-08 MED ORDER — PROPOFOL 10 MG/ML IV BOLUS
INTRAVENOUS | Status: DC | PRN
  Administered 2021-02-08: 100 mg via INTRAVENOUS

## 2021-02-08 MED ORDER — ALVIMOPAN 12 MG PO CAPS
12.0000 mg | ORAL_CAPSULE | ORAL | Status: AC
  Administered 2021-02-08: 12 mg via ORAL

## 2021-02-08 MED ORDER — SODIUM CHLORIDE 0.9 % IV SOLN
2.0000 g | Freq: Two times a day (BID) | INTRAVENOUS | Status: AC
  Administered 2021-02-08 – 2021-02-09 (×2): 2 g via INTRAVENOUS
  Filled 2021-02-08 (×2): qty 2

## 2021-02-08 MED ORDER — ORAL CARE MOUTH RINSE: 15.0000 mL | Freq: Once | OROMUCOSAL | Status: AC

## 2021-02-08 MED ORDER — ALVIMOPAN 12 MG PO CAPS
12.0000 mg | ORAL_CAPSULE | Freq: Two times a day (BID) | ORAL | Status: DC
  Administered 2021-02-09 – 2021-02-10 (×3): 12 mg via ORAL
  Filled 2021-02-08 (×3): qty 1

## 2021-02-08 MED ORDER — FENTANYL CITRATE (PF) 100 MCG/2ML IJ SOLN
INTRAMUSCULAR | Status: DC | PRN
  Administered 2021-02-08 (×5): 50 ug via INTRAVENOUS

## 2021-02-08 MED ORDER — KETOROLAC TROMETHAMINE 30 MG/ML IJ SOLN
INTRAMUSCULAR | Status: AC
  Filled 2021-02-08: qty 1

## 2021-02-08 MED ORDER — ENOXAPARIN SODIUM 40 MG/0.4ML ~~LOC~~ SOLN
40.0000 mg | SUBCUTANEOUS | Status: DC
  Administered 2021-02-09 – 2021-02-10 (×2): 40 mg via SUBCUTANEOUS
  Filled 2021-02-08 (×3): qty 0.4

## 2021-02-08 MED ORDER — GABAPENTIN 300 MG PO CAPS
300.0000 mg | ORAL_CAPSULE | Freq: Two times a day (BID) | ORAL | Status: DC
  Administered 2021-02-08 – 2021-02-11 (×7): 300 mg via ORAL
  Filled 2021-02-08 (×7): qty 1

## 2021-02-08 MED ORDER — KETOROLAC TROMETHAMINE 30 MG/ML IJ SOLN
30.0000 mg | Freq: Four times a day (QID) | INTRAMUSCULAR | Status: DC
  Administered 2021-02-08 – 2021-02-10 (×7): 30 mg via INTRAVENOUS
  Filled 2021-02-08 (×7): qty 1

## 2021-02-08 MED ORDER — ALVIMOPAN 12 MG PO CAPS
ORAL_CAPSULE | ORAL | Status: AC
  Filled 2021-02-08: qty 1

## 2021-02-08 MED ORDER — LACTATED RINGERS IV SOLN: INTRAVENOUS | Status: DC

## 2021-02-08 MED ORDER — ONDANSETRON HCL 4 MG PO TABS
4.0000 mg | ORAL_TABLET | Freq: Four times a day (QID) | ORAL | Status: DC | PRN
Start: 2021-02-08 — End: 2021-02-11

## 2021-02-08 MED ORDER — PROPOFOL 10 MG/ML IV BOLUS
INTRAVENOUS | Status: AC
  Filled 2021-02-08: qty 20

## 2021-02-08 MED ORDER — DEXAMETHASONE SODIUM PHOSPHATE 10 MG/ML IJ SOLN
INTRAMUSCULAR | Status: AC
  Filled 2021-02-08: qty 1

## 2021-02-08 MED ORDER — ONDANSETRON HCL 4 MG/2ML IJ SOLN: 4.0000 mg | Freq: Four times a day (QID) | INTRAMUSCULAR | Status: DC | PRN

## 2021-02-08 MED ORDER — ACETAMINOPHEN 500 MG PO TABS
1000.0000 mg | ORAL_TABLET | Freq: Four times a day (QID) | ORAL | Status: DC
  Administered 2021-02-08 – 2021-02-11 (×9): 1000 mg via ORAL
  Filled 2021-02-08 (×10): qty 2

## 2021-02-08 MED ORDER — BUPIVACAINE LIPOSOME 1.3 % IJ SUSP
INTRAMUSCULAR | Status: DC | PRN
  Administered 2021-02-08: 20 mL

## 2021-02-08 MED ORDER — ENOXAPARIN SODIUM 40 MG/0.4ML ~~LOC~~ SOLN
40.0000 mg | Freq: Once | SUBCUTANEOUS | Status: AC
  Administered 2021-02-08: 40 mg via SUBCUTANEOUS

## 2021-02-08 MED ORDER — KETOROLAC TROMETHAMINE 30 MG/ML IJ SOLN
INTRAMUSCULAR | Status: DC | PRN
  Administered 2021-02-08: 30 mg via INTRAVENOUS

## 2021-02-08 MED ORDER — CHLORHEXIDINE GLUCONATE 0.12 % MT SOLN
15.0000 mL | Freq: Once | OROMUCOSAL | Status: AC
  Administered 2021-02-08: 15 mL via OROMUCOSAL

## 2021-02-08 MED ORDER — OXYCODONE HCL 5 MG PO TABS: 5.0000 mg | ORAL_TABLET | ORAL | Status: DC | PRN

## 2021-02-08 MED ORDER — LIDOCAINE 2% (20 MG/ML) 5 ML SYRINGE
INTRAMUSCULAR | Status: DC | PRN
  Administered 2021-02-08: 100 mg via INTRAVENOUS

## 2021-02-08 MED ORDER — MORPHINE SULFATE (PF) 2 MG/ML IV SOLN
2.0000 mg | INTRAVENOUS | Status: DC | PRN
Start: 2021-02-08 — End: 2021-02-11
  Administered 2021-02-08 – 2021-02-10 (×4): 2 mg via INTRAVENOUS
  Filled 2021-02-08 (×4): qty 1

## 2021-02-08 MED ORDER — FENTANYL CITRATE (PF) 100 MCG/2ML IJ SOLN
INTRAMUSCULAR | Status: AC
  Filled 2021-02-08: qty 2

## 2021-02-08 MED ORDER — ROCURONIUM BROMIDE 10 MG/ML (PF) SYRINGE
PREFILLED_SYRINGE | INTRAVENOUS | Status: DC | PRN
  Administered 2021-02-08: 10 mg via INTRAVENOUS
  Administered 2021-02-08: 50 mg via INTRAVENOUS
  Administered 2021-02-08 (×2): 10 mg via INTRAVENOUS

## 2021-02-08 SURGICAL SUPPLY — 53 items
BAG HAMPER (MISCELLANEOUS) ×2 IMPLANT
CELLS DAT CNTRL 66122 CELL SVR (MISCELLANEOUS) IMPLANT
COVER LIGHT HANDLE STERIS (MISCELLANEOUS) ×4 IMPLANT
COVER WAND RF STERILE (DRAPES) ×2 IMPLANT
DRSG OPSITE POSTOP 4X8 (GAUZE/BANDAGES/DRESSINGS) ×2 IMPLANT
DRSG TEGADERM 2-3/8X2-3/4 SM (GAUZE/BANDAGES/DRESSINGS) ×6 IMPLANT
ELECT REM PT RETURN 9FT ADLT (ELECTROSURGICAL) ×2
ELECTRODE REM PT RTRN 9FT ADLT (ELECTROSURGICAL) ×1 IMPLANT
GAUZE SPONGE 4X4 12PLY STRL LF (GAUZE/BANDAGES/DRESSINGS) ×2 IMPLANT
GLOVE SURG ENC MOIS LTX SZ6.5 (GLOVE) ×4 IMPLANT
GLOVE SURG SS PI 7.5 STRL IVOR (GLOVE) ×4 IMPLANT
GLOVE SURG UNDER POLY LF SZ6.5 (GLOVE) ×4 IMPLANT
GLOVE SURG UNDER POLY LF SZ7 (GLOVE) ×12 IMPLANT
GOWN STRL REUS W/TWL LRG LVL3 (GOWN DISPOSABLE) ×12 IMPLANT
INST SET LAPROSCOPIC AP (KITS) ×2 IMPLANT
INST SET MAJOR GENERAL (KITS) ×2 IMPLANT
KIT TURNOVER CYSTO (KITS) ×2 IMPLANT
LIGASURE IMPACT 36 18CM CVD LR (INSTRUMENTS) ×2 IMPLANT
LIGASURE LAP ATLAS 10MM 37CM (INSTRUMENTS) ×2 IMPLANT
MANIFOLD NEPTUNE II (INSTRUMENTS) ×2 IMPLANT
NEEDLE HYPO 18GX1.5 BLUNT FILL (NEEDLE) ×2 IMPLANT
NEEDLE HYPO 21X1.5 SAFETY (NEEDLE) ×2 IMPLANT
NS IRRIG 1000ML POUR BTL (IV SOLUTION) ×6 IMPLANT
PACK COLON (CUSTOM PROCEDURE TRAY) ×2 IMPLANT
PAD ARMBOARD 7.5X6 YLW CONV (MISCELLANEOUS) ×2 IMPLANT
PENCIL SMOKE EVACUATOR COATED (MISCELLANEOUS) ×2 IMPLANT
RELOAD LINEAR CUT PROX 55 BLUE (ENDOMECHANICALS) IMPLANT
RELOAD PROXIMATE 75MM BLUE (ENDOMECHANICALS) ×2 IMPLANT
RETRACTOR WND ALEXIS 25 LRG (MISCELLANEOUS) IMPLANT
RTRCTR WOUND ALEXIS 18CM MED (MISCELLANEOUS)
RTRCTR WOUND ALEXIS 25CM LRG (MISCELLANEOUS)
SET TUBE SMOKE EVAC HIGH FLOW (TUBING) ×2 IMPLANT
SHEET LAVH (DRAPES) ×2 IMPLANT
SLEEVE ENDOPATH XCEL 5M (ENDOMECHANICALS) ×2 IMPLANT
SPONGE GAUZE 2X2 8PLY STRL LF (GAUZE/BANDAGES/DRESSINGS) ×6 IMPLANT
SPONGE LAP 18X18 RF (DISPOSABLE) ×4 IMPLANT
STAPLER GUN LINEAR PROX 60 (STAPLE) ×2 IMPLANT
STAPLER PROXIMATE 55 BLUE (STAPLE) IMPLANT
STAPLER PROXIMATE 75MM BLUE (STAPLE) ×2 IMPLANT
STAPLER VISISTAT (STAPLE) ×2 IMPLANT
SUT PDS AB CT VIOLET #0 27IN (SUTURE) ×4 IMPLANT
SUT PROLENE 2 0 SH 30 (SUTURE) IMPLANT
SUT SILK 3 0 SH CR/8 (SUTURE) ×4 IMPLANT
SUT VIC AB 3-0 SH 27 (SUTURE)
SUT VIC AB 3-0 SH 27X BRD (SUTURE) IMPLANT
SYR 10ML LL (SYRINGE) ×2 IMPLANT
SYR 20ML LL LF (SYRINGE) ×4 IMPLANT
TRAY FOLEY MTR SLVR 16FR STAT (SET/KITS/TRAYS/PACK) ×2 IMPLANT
TROCAR ENDO BLADELESS 11MM (ENDOMECHANICALS) ×2 IMPLANT
TROCAR XCEL NON-BLD 5MMX100MML (ENDOMECHANICALS) ×2 IMPLANT
TROCAR XCEL UNIV SLVE 11M 100M (ENDOMECHANICALS) ×2 IMPLANT
WARMER LAPAROSCOPE (MISCELLANEOUS) ×2 IMPLANT
YANKAUER SUCT BULB TIP 10FT TU (MISCELLANEOUS) ×4 IMPLANT

## 2021-02-08 NOTE — Anesthesia Procedure Notes (Signed)
Procedure Name: Intubation Date/Time: 02/08/2021 10:26 AM Performed by: Orlie Dakin, CRNA Pre-anesthesia Checklist: Patient identified, Emergency Drugs available, Suction available and Patient being monitored Patient Re-evaluated:Patient Re-evaluated prior to induction Oxygen Delivery Method: Circle system utilized Preoxygenation: Pre-oxygenation with 100% oxygen Induction Type: IV induction Ventilation: Mask ventilation without difficulty Laryngoscope Size: Miller and 3 Grade View: Grade I Tube type: Oral Tube size: 7.0 mm Number of attempts: 1 Airway Equipment and Method: Stylet Placement Confirmation: ETT inserted through vocal cords under direct vision,  positive ETCO2 and breath sounds checked- equal and bilateral Secured at: 22 cm Tube secured with: Tape Dental Injury: Teeth and Oropharynx as per pre-operative assessment

## 2021-02-08 NOTE — Transfer of Care (Signed)
Immediate Anesthesia Transfer of Care Note  Patient: OTELIA HETTINGER  Procedure(s) Performed: LAPAROSCOPIC PARTIAL COLECTOMY (N/A )  Patient Location: PACU  Anesthesia Type:General  Level of Consciousness: oriented and drowsy  Airway & Oxygen Therapy: Patient Spontanous Breathing and Patient connected to face mask oxygen  Post-op Assessment: Report given to RN and Post -op Vital signs reviewed and stable  Post vital signs: Reviewed and stable  Last Vitals:  Vitals Value Taken Time  BP 134/92 02/08/21 1236  Temp    Pulse 81 02/08/21 1238  Resp 19 02/08/21 1238  SpO2 98 % 02/08/21 1238  Vitals shown include unvalidated device data.  Last Pain:  Vitals:   02/08/21 0830  PainSc: 0-No pain         Complications: No complications documented.

## 2021-02-08 NOTE — Anesthesia Preprocedure Evaluation (Signed)
Anesthesia Evaluation  Patient identified by MRN, date of birth, ID band Patient awake    Reviewed: Allergy & Precautions, H&P , NPO status , Patient's Chart, lab work & pertinent test results, reviewed documented beta blocker date and time   Airway Mallampati: II  TM Distance: >3 FB Neck ROM: full    Dental no notable dental hx.    Pulmonary neg pulmonary ROS, Current Smoker,    Pulmonary exam normal breath sounds clear to auscultation       Cardiovascular Exercise Tolerance: Good negative cardio ROS   Rhythm:regular Rate:Normal     Neuro/Psych PSYCHIATRIC DISORDERS negative neurological ROS     GI/Hepatic negative GI ROS, Neg liver ROS,   Endo/Other  negative endocrine ROS  Renal/GU negative Renal ROS  negative genitourinary   Musculoskeletal   Abdominal   Peds  Hematology negative hematology ROS (+)   Anesthesia Other Findings   Reproductive/Obstetrics negative OB ROS                             Anesthesia Physical Anesthesia Plan  ASA: II  Anesthesia Plan: General   Post-op Pain Management:    Induction:   PONV Risk Score and Plan: Ondansetron  Airway Management Planned:   Additional Equipment:   Intra-op Plan:   Post-operative Plan:   Informed Consent: I have reviewed the patients History and Physical, chart, labs and discussed the procedure including the risks, benefits and alternatives for the proposed anesthesia with the patient or authorized representative who has indicated his/her understanding and acceptance.     Dental Advisory Given  Plan Discussed with: CRNA  Anesthesia Plan Comments:         Anesthesia Quick Evaluation

## 2021-02-08 NOTE — Progress Notes (Signed)
[facility]  Surgery completed notified the husband. Laparoscopic left hemicolectomy completed.   ERAS protocol  Clear diet Foley tonight Labs in AM SCDs, lovenox Abdominal binder    Curlene Labrum, MD [facility] [place], [place] [address] [phone] (office)

## 2021-02-08 NOTE — Interval H&P Note (Signed)
History and Physical Interval Note:  02/08/2021 10:23 AM  Felicia Huff  has presented today for surgery, with the diagnosis of Tubular adenoma, colon polyps.  The various methods of treatment have been discussed with the patient and family. After consideration of risks, benefits and other options for treatment, the patient has consented to  Procedure(s): LAPAROSCOPIC PARTIAL COLECTOMY (N/A) as a surgical intervention.  The patient's history has been reviewed, patient examined, no change in status, stable for surgery.  I have reviewed the patient's chart and labs.  Questions were answered to the patient's satisfaction.    No changes. Bowel prep completed. Antibiotics were vomited up.  Virl Cagey

## 2021-02-08 NOTE — Op Note (Signed)
[facility] Operative Note  02/08/21  Preoperative Diagnosis:  Transverse colon mass and descending colon polyp    Postoperative Diagnosis: Same   Procedure(s) Performed:  Laparoscopic left hemicolectomy with extracorporeal anastomosis    Surgeon: Lanell Matar. Constance Haw, MD   Assistants: Aviva Signs, MD    Anesthesia: General endotracheal   Anesthesiologist: Louann Sjogren, MD    Specimens:  Left colon (suture proximal)    Estimated Blood Loss: Minimal   Blood Replacement: None    Complications: None    Wound Class: Clean contaminated    Operative Indications:  Felicia Huff is a 50 yo who had a colonoscopy and polyps found in the transverse colon an descending colon. Dr. Abbey Chatters could not remove these polyps safely, and took biopsies which were benign. Given that they were not resectable he placed tattoos distally to both with plans for colectomy. I discussed colectomy with the patient and risk of bleeding, infection, anastomotic leak, injury to other organs, and finding something like cancer.   Findings: Two tattoo areas transverse colon and descending colon    Procedure: The patient was taken to the operating room and placed supine. General endotracheal anesthesia was induced. Intravenous antibiotics were administered per protocol.  A foley catheter was placed and the patient was placed in lithotomy with all pressure points padded.  An orogastric tube positioned to decompress the stomach. The abdomen and perineum were prepared and draped in the usual sterile fashion.   An infraumbilical incision was made and a towel clip elevated the abdominal wall. A Veress was inserted and the saline drop test was performed. Entry into the peritoneal cavity was confirmed with low insufflation pressures.  An 11 mm trocar was placed under direct visualization and no injury was noted beneath the umbilicus. An additional 11 mm trocar was placed in the right lower quadrant as well as  a 5 mm trocar in the right mid abdomen.  My assistant had a 5 mm  trocar placed in the left mid abdomen. This were all placed under visualization.  The patient was placed in reverse Trendelenburg position.  The tattoo in the transverse colon was noted as well as in the proximal descending colon.  The splenic flexure was mobilized by elevating the omentum from the transverse colon and getting into the lesser sac. This dissection was carried laterally with a Ligasure to the splenic flexure. The colon was then pulled down inferiorly and additional mobilization was down on the White line of Toldt down the descending colon.   The colon was swept down and medial and both areas of tattoo were easily mobilized.  A portion of the mesentery was dissected from the transverse colon over to the splenic flexure with the Ligasure, ensuring no injury to the underlying bowel.    After this mobilization, there was sufficient laxity to do an extracorporeal anastomosis. The infraumbilical incision was carried superiorly for the extraction site, and a small wound protector was placed. The transverse colon and descending colon were easily eviscerated. Towels were draped out.   Using a linear 75 mm cutting stapler the proximal and distal points of resection were determined and divided. The middle colic was preserved.  The remaining mesentery was taken with a Ligasure.   The colon was tagged short suture proximal and passed off the field. I opened the specimen to ensure we obtained the masses as described by Dr. Abbey Chatters and there was a polypoid lesion in the transverse colon and a polyp in the descending colon  adjacent to the tattoos and with adequate margins. I changed into new gloves.   The two ends of colon were brought side to side and a standard side to side anastomosis was performed at the tinea ensuring that the mesentery was not compromised.  A 75 mm linear cutting stapler was used to create the common channel, and TA 60 mm  stapler was used to close the common colotomy.  This was oversewn with 3-0 interrupted Lembert silk suture, and 2 crotch sutures were placed using 3-0 silk interrupted.  Omentum was placed over the anastomosis which was over 2 fingerbreadths in size.   The abdomen was irrigated. Hemostasis was confirmed.  The entire team changed gown and gloves and new equipment was used for closing.  The extraction site was closed in the standard fashion using 0 PDS suture. The skin of the extraction site and the port sites were closed with staples. Steriles dressings were placed.   Dr. Arnoldo Morale was assisting throughout the procedure and was present for the critical portions of the case.   Final inspection revealed acceptable hemostasis. All counts were correct at the end of the case. The patient was awakened from anesthesia and extubated without complication.  The patient went to the PACU in stable condition.   Curlene Labrum, MD [facility] [address] [place], [place] [address] [phone] (office)

## 2021-02-08 NOTE — Anesthesia Postprocedure Evaluation (Signed)
Anesthesia Post Note  Patient: Felicia Huff  Procedure(s) Performed: LAPAROSCOPIC PARTIAL COLECTOMY (N/A )  Patient location during evaluation: Phase II Anesthesia Type: General Level of consciousness: awake Pain management: pain level controlled Vital Signs Assessment: post-procedure vital signs reviewed and stable Respiratory status: spontaneous breathing and respiratory function stable Cardiovascular status: blood pressure returned to baseline and stable Postop Assessment: no headache and no apparent nausea or vomiting Anesthetic complications: no Comments: Late entry   No complications documented.   Last Vitals:  Vitals:   02/08/21 1501 02/08/21 1529  BP: 114/74 128/76  Pulse: 69 68  Resp: 20 18  Temp: 36.7 C 36.7 C  SpO2: 100% 100%    Last Pain:  Vitals:   02/08/21 1529  TempSrc: Oral  PainSc: [place]

## 2021-02-09 ENCOUNTER — Encounter (HOSPITAL_COMMUNITY): Payer: Self-pay | Admitting: General Surgery

## 2021-02-09 LAB — CBC WITH DIFFERENTIAL/PLATELET
Abs Immature Granulocytes: 0.04 10*3/uL (ref 0.00–0.07)
Basophils Absolute: 0 10*3/uL (ref 0.0–0.1)
Basophils Relative: 0 %
Eosinophils Absolute: 0 10*3/uL (ref 0.0–0.5)
Eosinophils Relative: 0 %
HCT: 23 % — ABNORMAL LOW (ref 36.0–46.0)
Hemoglobin: 7.5 g/dL — ABNORMAL LOW (ref 12.0–15.0)
Immature Granulocytes: 0 %
Lymphocytes Relative: 12 %
Lymphs Abs: 1.2 10*3/uL (ref 0.7–4.0)
MCH: 32.2 pg (ref 26.0–34.0)
MCHC: 32.6 g/dL (ref 30.0–36.0)
MCV: 98.7 fL (ref 80.0–100.0)
Monocytes Absolute: 0.6 10*3/uL (ref 0.1–1.0)
Monocytes Relative: 6 %
Neutro Abs: 8.2 10*3/uL — ABNORMAL HIGH (ref 1.7–7.7)
Neutrophils Relative %: 82 %
Platelets: 181 10*3/uL (ref 150–400)
RBC: 2.33 MIL/uL — ABNORMAL LOW (ref 3.87–5.11)
RDW: 13.7 % (ref 11.5–15.5)
WBC: 10 10*3/uL (ref 4.0–10.5)
nRBC: 0 % (ref 0.0–0.2)

## 2021-02-09 LAB — BASIC METABOLIC PANEL
Anion gap: 8 (ref 5–15)
BUN: 11 mg/dL (ref 6–20)
CO2: 24 mmol/L (ref 22–32)
Calcium: 7.8 mg/dL — ABNORMAL LOW (ref 8.9–10.3)
Chloride: 103 mmol/L (ref 98–111)
Creatinine, Ser: 0.94 mg/dL (ref 0.44–1.00)
GFR, Estimated: >60 mL/min (ref >60)
Glucose, Bld: 107 mg/dL — ABNORMAL HIGH (ref 70–99)
Potassium: 4.2 mmol/L (ref 3.5–5.1)
Sodium: 135 mmol/L (ref 135–145)

## 2021-02-09 LAB — PHOSPHORUS: Phosphorus: 3.3 mg/dL (ref 2.5–4.6)

## 2021-02-09 LAB — MAGNESIUM: Magnesium: 1.5 mg/dL — ABNORMAL LOW (ref 1.7–2.4)

## 2021-02-09 MED ORDER — MAGNESIUM SULFATE 2 GM/50ML IV SOLN
2.0000 g | Freq: Once | INTRAVENOUS | Status: AC
  Administered 2021-02-09: 2 g via INTRAVENOUS
  Filled 2021-02-09: qty 50

## 2021-02-09 NOTE — Plan of Care (Signed)
  Problem: Education: Goal: Knowledge of General Education information will improve Description Including pain rating scale, medication(s)/side effects and non-pharmacologic comfort measures Outcome: Progressing   Problem: Health Behavior/Discharge Planning: Goal: Ability to manage health-related needs will improve Outcome: Progressing   

## 2021-02-09 NOTE — Progress Notes (Signed)
[facility] Progress Note  1 Day Post-Op  Subjective: Sore but doing ok. Tolerated clears. No BM but having flatus.   Objective: Vital signs in last 24 hours: Temp:  [98 F (36.7 C)-99 F (37.2 C)] 98 F (36.7 C) (04/07 1430) Pulse Rate:  [60-73] 73 (04/07 1430) Resp:  [16-20] 16 (04/07 1430) BP: (93-128)/(62-76) 93/62 (04/07 1430) SpO2:  [85 %-100 %] 85 % (04/07 1430) Weight:  [73.2 kg-73.9 kg] 73.9 kg (04/07 0500) Last BM Date: 02/07/21  Intake/Output from previous day: 04/06 0701 - 04/07 0700 In: 5299.1 [P.O.:600; I.V.:4350; IV Piggyback:349.1] Out: 850 [Urine:775; Blood:75] Intake/Output this shift: Total I/O In: 909.1 [P.O.:480; I.V.:379.1; IV Piggyback:50] Out: 400 [Urine:400]  General appearance: alert, cooperative and no distress Resp: normal work of breathing GI: soft, mildly distended, honeycomb dressing in place and dressing in place, no drainage, ABD binder  Lab Results:  Recent Labs    02/09/21 0733  WBC 10.0  HGB 7.5*  HCT 23.0*  PLT 181   BMET Recent Labs    02/09/21 0733  NA 135  K 4.2  CL 103  CO2 24  GLUCOSE 107*  BUN 11  CREATININE 0.94  CALCIUM 7.8*   PT/INR No results for input(s): LABPROT, INR in the last 72 hours.  Studies/Results: No results found.  Anti-infectives: Anti-infectives (From admission, onward)   Start     Dose/Rate Route Frequency Ordered Stop   02/08/21 2200  cefoTEtan (CEFOTAN) 2 g in sodium chloride 0.9 % 100 mL IVPB        2 g 200 mL/hr over 30 Minutes Intravenous Every 12 hours 02/08/21 1511 02/09/21 1042   02/08/21 0815  cefoTEtan (CEFOTAN) 2 g in sodium chloride 0.9 % 100 mL IVPB        2 g 200 mL/hr over 30 Minutes Intravenous On call to O.R. 02/08/21 0802 02/08/21 1900      Assessment/Plan: Ms. Meadow is a 50 yo s/p laparoscopic left hemicolectomy doing well. Pathology pending. ERAS for pain IS, OOB HD ok Full liquid diet Awaiting BM, entereg Foley out DC ivf  SCDs,  lovenox  H&H drifted slightly, monitor, may have some bloody BMs   LOS: 1 day    Virl Cagey 02/09/2021

## 2021-02-10 LAB — BASIC METABOLIC PANEL
Anion gap: 8 (ref 5–15)
BUN: 9 mg/dL (ref 6–20)
CO2: 25 mmol/L (ref 22–32)
Calcium: 7.7 mg/dL — ABNORMAL LOW (ref 8.9–10.3)
Chloride: 104 mmol/L (ref 98–111)
Creatinine, Ser: 0.87 mg/dL (ref 0.44–1.00)
GFR, Estimated: >60 mL/min (ref >60)
Glucose, Bld: 109 mg/dL — ABNORMAL HIGH (ref 70–99)
Potassium: 3.1 mmol/L — ABNORMAL LOW (ref 3.5–5.1)
Sodium: 137 mmol/L (ref 135–145)

## 2021-02-10 LAB — CBC WITH DIFFERENTIAL/PLATELET
Abs Immature Granulocytes: 0.03 10*3/uL (ref 0.00–0.07)
Basophils Absolute: 0.1 10*3/uL (ref 0.0–0.1)
Basophils Relative: 1 %
Eosinophils Absolute: 0 10*3/uL (ref 0.0–0.5)
Eosinophils Relative: 1 %
HCT: 23.6 % — ABNORMAL LOW (ref 36.0–46.0)
Hemoglobin: 7.7 g/dL — ABNORMAL LOW (ref 12.0–15.0)
Immature Granulocytes: 1 %
Lymphocytes Relative: 22 %
Lymphs Abs: 1.3 10*3/uL (ref 0.7–4.0)
MCH: 32 pg (ref 26.0–34.0)
MCHC: 32.6 g/dL (ref 30.0–36.0)
MCV: 97.9 fL (ref 80.0–100.0)
Monocytes Absolute: 0.4 10*3/uL (ref 0.1–1.0)
Monocytes Relative: 6 %
Neutro Abs: 4.2 10*3/uL (ref 1.7–7.7)
Neutrophils Relative %: 69 %
Platelets: 180 10*3/uL (ref 150–400)
RBC: 2.41 MIL/uL — ABNORMAL LOW (ref 3.87–5.11)
RDW: 14 % (ref 11.5–15.5)
WBC: 6 10*3/uL (ref 4.0–10.5)
nRBC: 0 % (ref 0.0–0.2)

## 2021-02-10 LAB — SURGICAL PATHOLOGY

## 2021-02-10 LAB — MAGNESIUM: Magnesium: 1.8 mg/dL (ref 1.7–2.4)

## 2021-02-10 MED ORDER — POTASSIUM CHLORIDE 20 MEQ PO PACK
80.0000 meq | PACK | Freq: Once | ORAL | Status: DC
  Filled 2021-02-10: qty 4

## 2021-02-10 MED ORDER — MAGNESIUM SULFATE 2 GM/50ML IV SOLN
2.0000 g | Freq: Once | INTRAVENOUS | Status: AC
  Administered 2021-02-10: 2 g via INTRAVENOUS
  Filled 2021-02-10: qty 50

## 2021-02-10 MED ORDER — POTASSIUM CHLORIDE CRYS ER 20 MEQ PO TBCR
80.0000 meq | EXTENDED_RELEASE_TABLET | Freq: Once | ORAL | Status: AC
  Administered 2021-02-10: 80 meq via ORAL
  Filled 2021-02-10: qty 4

## 2021-02-10 MED ORDER — IBUPROFEN 600 MG PO TABS
600.0000 mg | ORAL_TABLET | Freq: Four times a day (QID) | ORAL | Status: DC
  Administered 2021-02-10 – 2021-02-11 (×4): 600 mg via ORAL
  Filled 2021-02-10 (×4): qty 1

## 2021-02-10 NOTE — Plan of Care (Signed)

## 2021-02-10 NOTE — Progress Notes (Addendum)
[facility] Progress Note  2 Days Post-Op  Subjective: Having Bms and tolerating diet. Wanting more to eat. Pain ok but sore.   Objective: Vital signs in last 24 hours: Temp:  [98 F (36.7 C)-98.6 F (37 C)] 98.3 F (36.8 C) (04/08 1243) Pulse Rate:  [68-81] 68 (04/08 1243) Resp:  [16-18] 18 (04/08 1243) BP: (93-123)/(62-83) 114/74 (04/08 1243) SpO2:  [85 %-93 %] 93 % (04/08 0605) Weight:  [67.7 kg-75.9 kg] 75.9 kg (04/08 0700) Last BM Date: 02/10/21  Intake/Output from previous day: 04/07 0701 - 04/08 0700 In: 1149.1 [P.O.:720; I.V.:379.1; IV Piggyback:50] Out: 400 [Urine:400] Intake/Output this shift: Total I/O In: 240 [P.O.:240] Out: -   General appearance: alert, cooperative and no distress Resp: normal work of breathing GI: soft, nondistended, appropriately tender, staples c/d/i without erythema or drainage  Lab Results:  Recent Labs    02/09/21 0733 02/10/21 0542  WBC 10.0 6.0  HGB 7.5* 7.7*  HCT 23.0* 23.6*  PLT 181 180   BMET Recent Labs    02/09/21 0733 02/10/21 0542  NA 135 137  K 4.2 3.1*  CL 103 104  CO2 24 25  GLUCOSE 107* 109*  BUN 11 9  CREATININE 0.94 0.87  CALCIUM 7.8* 7.7*   PT/INR No results for input(s): LABPROT, INR in the last 72 hours.  Studies/Results: No results found.  Anti-infectives: Anti-infectives (From admission, onward)   Start     Dose/Rate Route Frequency Ordered Stop   02/08/21 2200  cefoTEtan (CEFOTAN) 2 g in sodium chloride 0.9 % 100 mL IVPB        2 g 200 mL/hr over 30 Minutes Intravenous Every 12 hours 02/08/21 1511 02/09/21 1042   02/08/21 0815  cefoTEtan (CEFOTAN) 2 g in sodium chloride 0.9 % 100 mL IVPB        2 g 200 mL/hr over 30 Minutes Intravenous On call to O.R. 02/08/21 0802 02/08/21 1900      Assessment/Plan: Ms. Hollenback is a 50 yo s/p lap left hemicolectomy doing fair.  ERAS for pain, stopped toradol and changed to ibuprofen IS, OOB HD ok Soft diet Stopped entereg H&H  stable, no signs of bleeding K and Mg replaced SCDs, lovenox   Maybe home in next 48 hours.  Pathology: FINAL MICROSCOPIC DIAGNOSIS:   A. LEFT COLON, PARTIAL COLECTOMY:  - Tubular adenoma, negative for high-grade dysplasia (X3).  - Margins of resection are not involved.  - Thirteen lymph nodes, negative for carcinoma (0/13).   LOS: 2 days    Virl Cagey 02/10/2021

## 2021-02-10 NOTE — Plan of Care (Signed)
  Problem: Skin Integrity: Goal: Risk for impaired skin integrity will decrease Outcome: Progressing   Problem: Clinical Measurements: Goal: Postoperative complications will be avoided or minimized Outcome: Progressing   Problem: Skin Integrity: Goal: Demonstration of wound healing without infection will improve Outcome: Progressing

## 2021-02-11 LAB — CBC WITH DIFFERENTIAL/PLATELET
Abs Immature Granulocytes: 0.01 10*3/uL (ref 0.00–0.07)
Basophils Absolute: 0 10*3/uL (ref 0.0–0.1)
Basophils Relative: 1 %
Eosinophils Absolute: 0.1 10*3/uL (ref 0.0–0.5)
Eosinophils Relative: 2 %
HCT: 24.6 % — ABNORMAL LOW (ref 36.0–46.0)
Hemoglobin: 8.1 g/dL — ABNORMAL LOW (ref 12.0–15.0)
Immature Granulocytes: 0 %
Lymphocytes Relative: 32 %
Lymphs Abs: 1.6 10*3/uL (ref 0.7–4.0)
MCH: 32.5 pg (ref 26.0–34.0)
MCHC: 32.9 g/dL (ref 30.0–36.0)
MCV: 98.8 fL (ref 80.0–100.0)
Monocytes Absolute: 0.3 10*3/uL (ref 0.1–1.0)
Monocytes Relative: 6 %
Neutro Abs: 2.8 10*3/uL (ref 1.7–7.7)
Neutrophils Relative %: 59 %
Platelets: 221 10*3/uL (ref 150–400)
RBC: 2.49 MIL/uL — ABNORMAL LOW (ref 3.87–5.11)
RDW: 13.7 % (ref 11.5–15.5)
WBC: 4.9 10*3/uL (ref 4.0–10.5)
nRBC: 0 % (ref 0.0–0.2)

## 2021-02-11 LAB — BASIC METABOLIC PANEL
Anion gap: 9 (ref 5–15)
BUN: 9 mg/dL (ref 6–20)
CO2: 27 mmol/L (ref 22–32)
Calcium: 8.1 mg/dL — ABNORMAL LOW (ref 8.9–10.3)
Chloride: 103 mmol/L (ref 98–111)
Creatinine, Ser: 0.76 mg/dL (ref 0.44–1.00)
GFR, Estimated: >60 mL/min (ref >60)
Glucose, Bld: 102 mg/dL — ABNORMAL HIGH (ref 70–99)
Potassium: 3.7 mmol/L (ref 3.5–5.1)
Sodium: 139 mmol/L (ref 135–145)

## 2021-02-11 MED ORDER — ONDANSETRON HCL 4 MG PO TABS: 4.0000 mg | ORAL_TABLET | Freq: Four times a day (QID) | ORAL | 0 refills | Status: DC | PRN

## 2021-02-11 MED ORDER — DOCUSATE SODIUM 100 MG PO CAPS: 100.0000 mg | ORAL_CAPSULE | Freq: Two times a day (BID) | ORAL | 2 refills | Status: DC | PRN

## 2021-02-11 MED ORDER — OXYCODONE HCL 5 MG PO TABS: 5.0000 mg | ORAL_TABLET | ORAL | 0 refills | Status: DC | PRN

## 2021-02-11 NOTE — Plan of Care (Signed)
  Problem: Education: Goal: Knowledge of General Education information will improve Description Including pain rating scale, medication(s)/side effects and non-pharmacologic comfort measures Outcome: Progressing   Problem: Health Behavior/Discharge Planning: Goal: Ability to manage health-related needs will improve Outcome: Progressing   

## 2021-02-11 NOTE — Discharge Summary (Signed)
Physician Discharge Summary  Patient ID: Felicia Huff MRN: [id number] DOB/AGE: Mar 15, 1971 50 y.o.  Admit date: 02/08/2021 Discharge date: 02/12/2021  Admission Diagnoses: Colon mass   Discharge Diagnoses:  Principal Problem:   Mass of colon Active Problems:   Colonic mass  Pathology:  FINAL MICROSCOPIC DIAGNOSIS:   A. LEFT COLON, PARTIAL COLECTOMY:  - Tubular adenoma, negative for high-grade dysplasia (X3).  - Margins of resection are not involved.  - Thirteen lymph nodes, negative for carcinoma (0/13).   Discharged Condition: good  Hospital Course: Felicia Huff is a 50 yo with multiple unresectable polyps in the colon that had concern for possible cancer. She came in and had a laparoscopic left hemicolectomy and did well post op. She had good pain control, was tolerating a diet, and was ambulating. She had BMs and was ready to go home.   Consults: None   Significant Diagnostic Studies: None   Treatments: 02/08/2021 Laparoscopic left hemicolectomy with extracorporeal anastomosis   Discharge Exam: Blood pressure 126/67, pulse 60, temperature 97.6 F (36.4 C), resp. rate 16, height 5\' 6"  (1.676 m), weight 75.3 kg, last menstrual period 02/06/2021, SpO2 94 %. General appearance: alert, cooperative and no distress Resp: normal work of breathing GI: soft, nondistended, appropriately tender, staples c/d/i with no erythema or drainage  Disposition: Discharge disposition: 01-Home or Self Care       Discharge Instructions    Call MD for:  difficulty breathing, headache or visual disturbances   Complete by: As directed    Call MD for:  extreme fatigue   Complete by: As directed    Call MD for:  hives   Complete by: As directed    Call MD for:  persistant dizziness or light-headedness   Complete by: As directed    Call MD for:  persistant nausea and vomiting   Complete by: As directed    Call MD for:  redness, tenderness, or signs of infection (pain, swelling, redness,  odor or green/yellow discharge around incision site)   Complete by: As directed    Call MD for:  severe uncontrolled pain   Complete by: As directed    Call MD for:  temperature >100.4   Complete by: As directed    Diet - low sodium heart healthy   Complete by: As directed    Increase activity slowly   Complete by: As directed      Allergies as of 02/11/2021   No Known Allergies     Medication List    TAKE these medications   docusate sodium 100 MG capsule Commonly known as: Colace Take 1 capsule (100 mg total) by mouth 2 (two) times daily as needed for mild constipation.   ondansetron 4 MG tablet Commonly known as: ZOFRAN Take 1 tablet (4 mg total) by mouth every 6 (six) hours as needed for nausea.   oxyCODONE 5 MG immediate release tablet Commonly known as: Oxy IR/ROXICODONE Take 1 tablet (5 mg total) by mouth every 4 (four) hours as needed for severe pain or breakthrough pain.       Follow-up Information    Virl Cagey, MD Follow up on 02/23/2021.   Specialty: General Surgery Why: staple removal  Contact information: [address] [place]na [place] [facility] [address] [phone]               Signed: Virl Cagey 02/12/2021, 9:12 AM

## 2021-02-11 NOTE — Plan of Care (Signed)
  Problem: Education: Goal: Knowledge of General Education information will improve Description: Including pain rating scale, medication(s)/side effects and non-pharmacologic comfort measures 02/11/2021 1226 by Santa Lighter, RN Outcome: Adequate for Discharge 02/11/2021 0941 by Santa Lighter, RN Outcome: Progressing   Problem: Health Behavior/Discharge Planning: Goal: Ability to manage health-related needs will improve 02/11/2021 1226 by Santa Lighter, RN Outcome: Adequate for Discharge 02/11/2021 0941 by Santa Lighter, RN Outcome: Progressing   Problem: Clinical Measurements: Goal: Ability to maintain clinical measurements within normal limits will improve Outcome: Adequate for Discharge Goal: Will remain free from infection Outcome: Adequate for Discharge Goal: Diagnostic test results will improve Outcome: Adequate for Discharge Goal: Respiratory complications will improve Outcome: Adequate for Discharge Goal: Cardiovascular complication will be avoided Outcome: Adequate for Discharge   Problem: Activity: Goal: Risk for activity intolerance will decrease Outcome: Adequate for Discharge   Problem: Nutrition: Goal: Adequate nutrition will be maintained Outcome: Adequate for Discharge   Problem: Coping: Goal: Level of anxiety will decrease Outcome: Adequate for Discharge   Problem: Elimination: Goal: Will not experience complications related to bowel motility Outcome: Adequate for Discharge Goal: Will not experience complications related to urinary retention Outcome: Adequate for Discharge   Problem: Pain Managment: Goal: General experience of comfort will improve Outcome: Adequate for Discharge   Problem: Safety: Goal: Ability to remain free from injury will improve Outcome: Adequate for Discharge   Problem: Skin Integrity: Goal: Risk for impaired skin integrity will decrease Outcome: Adequate for Discharge   Problem: Education: Goal: Required Educational  Video(s) Outcome: Adequate for Discharge   Problem: Clinical Measurements: Goal: Postoperative complications will be avoided or minimized Outcome: Adequate for Discharge   Problem: Skin Integrity: Goal: Demonstration of wound healing without infection will improve Outcome: Adequate for Discharge

## 2021-02-11 NOTE — Discharge Instructions (Signed)
Discharge Open Abdominal Surgery Instructions:  Common Complaints: Pain at the incision site is common. This will improve with time. Take your pain medications as described below. Some nausea is common and poor appetite. The main goal is to stay hydrated the first few days after surgery.   Diet/ Activity: Diet as tolerated. You have started and tolerated a diet in the hospital, and should continue to increase what you are able to eat.   You may not have a large appetite, but it is important to stay hydrated. Drink 64 ounces of water a day. Your appetite will return with time.  Keep a dry dressing in place over your staples daily or as needed. Some minor pink/ blood tinged drainage is expected. This will stop in a few days after surgery.  Shower per your regular routine daily.  Do not take hot showers. Take warm showers that are less than 10 minutes. Path the incision dry. Wear an abdominal binder daily with activity. You do not have to wear this while sleeping or sitting.  Rest and listen to your body, but do not remain in bed all day.  Walk everyday for at least 15-20 minutes. Deep cough and move around every 1-2 hours in the first few days after surgery.  Do not lift > 10 lbs, perform excessive bending, pushing, pulling, squatting for 6-8 weeks after surgery.  The activity restrictions and the abdominal binder are to prevent hernia formation at your incision while you are healing.  Do not place lotions or balms on your incision unless instructed to specifically by Dr. Milisa Kimbell.   Pain Expectations and Narcotics: -After surgery you will have pain associated with your incisions and this is normal. The pain is muscular and nerve pain, and will get better with time. -You are encouraged and expected to take non narcotic medications like tylenol and ibuprofen (when able) to treat pain as multiple modalities can aid with pain treatment. -Narcotics are only used when pain is severe or there is  breakthrough pain. -You are not expected to have a pain score of 0 after surgery, as we cannot prevent pain. A pain score of 3-4 that allows you to be functional, move, walk, and tolerate some activity is the goal. The pain will continue to improve over the days after surgery and is dependent on your surgery. -Due to [place] law, we are only able to give a certain amount of pain medication to treat post operative pain, and we only give additional narcotics on a patient by patient basis.  -For most laparoscopic surgery, studies have shown that the majority of patients only need 10-15 narcotic pills, and for open surgeries most patients only need 15-20.   -Having appropriate expectations of pain and knowledge of pain management with non narcotics is important as we do not want anyone to become addicted to narcotic pain medication.  -Using ice packs in the first 48 hours and heating pads after 48 hours, wearing an abdominal binder (when recommended), and using over the counter medications are all ways to help with pain management.   -Simple acts like meditation and mindfulness practices after surgery can also help with pain control and research has proven the benefit of these practices.  Medication: Take tylenol and ibuprofen as needed for pain control, alternating every 4-6 hours.  Example:  Tylenol 1000mg @ 6am, 12noon, 6pm, 12midnight (Do not exceed 4000mg of tylenol a day). Ibuprofen 800mg @ 9am, 3pm, 9pm, 3am (Do not exceed 3600mg of ibuprofen a day).    Take Roxicodone for breakthrough pain every 4 hours.  Take Colace for constipation related to narcotic pain medication. If you do not have a bowel movement in 2 days, take Miralax over the counter.  Drink plenty of water to also prevent constipation.   Contact Information: If you have questions or concerns, please call our office, [phone], Monday- Thursday 8AM-5PM and Friday 8AM-12Noon.  If it is after hours or on the weekend, please call [facility]  Main Number, [phone], [phone], and ask to speak to the surgeon on call for Dr. Constance Haw at [facility]    Minimally Invasive Colectomy, Care After This sheet gives you information about how to care for yourself after your procedure. Your health care provider may also give you more specific instructions. If you have problems or questions, contact your health care provider. What can I expect after the procedure? After your procedure, it is common to have the following:  Pain in your abdomen, especially in the incision areas. You will be given medicine to control the pain.  Tiredness. This is a normal part of the recovery process. Your energy level will return to normal over the next several weeks.  Changes in your bowel movements, especially having bowel movements more often. Talk with your health care provider about how to manage this. Follow these instructions at home: Medicines  Take over-the-counter and prescription medicines only as told by your health care provider.  If you were prescribed an antibiotic medicine, take it as told by your health care provider. Do not stop using the antibiotic even if you start to feel better.  Ask your health care provider if the medicine prescribed to you requires you to avoid driving or using machinery. Eating and drinking  Follow instructions from your health care provider about what you can eat after surgery. You may be asked to begin with a diet that is low in fiber.  Do not drink alcohol if: ? Your health care provider tells you not to drink. ? You are pregnant, may be pregnant, or are planning to become pregnant.  If you drink alcohol: ? Limit how much you use to:  0-1 drink a day for women.  0-2 drinks a day for men. ? Be aware of how much alcohol is in your drink. In the [place] one drink equals one 12 oz bottle of beer (355 mL), one 5 oz glass of wine (148 mL), or one 1 oz glass of hard liquor (44 mL). Incision care  Follow  instructions from your health care provider about how to take care of your incisions. Make sure you: ? Wash your hands with soap and water for at least 20 seconds before and after applying medicine to the area, and before and after changing your bandage (dressing). If soap and water are not available, use hand sanitizer. ? Change your dressing as told by your health care provider. ? Leave stitches (sutures) or staples in place. These skin closures may need to stay in place for 2 weeks or longer. If adhesive strip edges start to loosen and curl up, you may trim the loose edges. Do not remove adhesive strips completely unless your health care provider tells you to do that.  Keep your incisions clean and dry.  Do not wear tight clothing over the incisions. Tight clothing may rub and irritate the incision areas, which may cause the incisions to open.  Do not take baths, swim, or use a hot tub until your health care provider approves. Ask your  health care provider if you may take showers. You may only be allowed to take sponge baths.  Check your incision area every day for signs of infection. Check for: ? More redness, swelling, or pain. ? Fluid or blood. ? Warmth. ? Pus or a bad smell.   Activity  Rest as told by your health care provider.  Avoid sitting for a long time without moving. Get up to take short walks every 1-2 hours. This is important to improve blood flow and breathing. Ask for help if you feel weak or unsteady.  Do not lift anything that is heavier than 10 lb (4.5 kg), or the limit that you are told, until your health care provider says that it is safe.  Return to your normal activities as told by your health care provider. Ask your health care provider what activities are safe for you. General instructions  Keep all follow-up visits as told by your health care provider. This is important. You may need a follow-up visit to remove sutures or staples. Contact a health care  provider if:  You have more redness, swelling, or pain around your incisions.  You have fluid or blood coming from the incisions.  Your incisions feel warm to the touch.  You have pus or a bad smell coming from your incisions or your dressing.  You have a fever.  Your incisions break open after sutures or staples have been removed. Get help right away if:  You develop a rash.  You have chest pain or difficulty breathing.  You have pain or swelling in your legs.  You feel light-headed or you faint.  Your abdomen swells (becomes distended).  You have nausea or vomiting.  You have blood in your stool. Summary  After the procedure, it is common to have pain in the abdomen, tiredness, or changes in bowel movements.  Follow instructions from your health care provider about how to care for your incisions.  Begin your diet with low-fiber foods. Your health care provider will tell you when to resume your regular diet.  Contact a health care provider if you have any signs of infection, such as a fever, more redness, swelling, or pain around your incisions, or a bad smell coming from your incisions.  Get help right away if you have a rash, nausea, chest pain, pain or swelling in your legs, or blood in your stool. This information is not intended to replace advice given to you by your health care provider. Make sure you discuss any questions you have with your health care provider. Document Revised: 09/30/2019 Document Reviewed: 09/30/2019 Elsevier Patient Education  2021 [employer].

## 2021-02-14 LAB — TYPE AND SCREEN
ABO/RH(D): A POS
Antibody Screen: NEGATIVE
Unit division: 0

## 2021-02-14 LAB — BPAM RBC
Blood Product Expiration Date: 202205062359
Unit Type and Rh: 6200

## 2021-02-21 ENCOUNTER — Telehealth: Payer: Self-pay | Admitting: Internal Medicine

## 2021-02-21 NOTE — Telephone Encounter (Signed)
Patient's PCP is asking when is repeat colonoscopy recommended. Please advise. [facility] [phone]

## 2021-02-23 ENCOUNTER — Encounter: Payer: Self-pay | Admitting: General Surgery

## 2021-02-23 ENCOUNTER — Ambulatory Visit (INDEPENDENT_AMBULATORY_CARE_PROVIDER_SITE_OTHER): Payer: Medicaid Other | Admitting: General Surgery

## 2021-02-23 VITALS — BP 130/82 | HR 85 | Temp 98.3°F | Resp 12 | Ht 66.0 in | Wt 146.0 lb

## 2021-02-23 DIAGNOSIS — K6389 Other specified diseases of intestine: Secondary | ICD-10-CM

## 2021-02-23 NOTE — Patient Instructions (Addendum)
No heavy lifting > 10 lbs, excessive bending, pushing, pulling, or squatting for 6-8 weeks after surgery.   Steri strips will fall off/ peel up in 5-7 days. Ok to shower. Ok to remove once peeling up.  Diet as tolerated. Colonoscopy per GI.  Keep stools soft and regular/ stool softener/ Fiber etc.

## 2021-02-23 NOTE — Progress Notes (Signed)
[facility] Note   HPI:  50 y.o. Female presents to clinic for post-op follow-up evaluation after laparoscopic left hemicolectomy. doing well and eating.   Review of Systems:  No fever or chills Having Bms All other review of systems: otherwise negative   Vital Signs:  BP 130/82   Pulse 85   Temp 98.3 F (36.8 C) (Other (Comment))   Resp 12   Ht 5\' 6"  (1.676 m)   Wt 146 lb (66.2 kg)   LMP 02/06/2021   SpO2 99%   BMI 23.57 kg/m    Physical Exam:  Physical Exam Vitals reviewed.  Cardiovascular:     Rate and Rhythm: Normal rate.  Pulmonary:     Effort: Pulmonary effort is normal.  Abdominal:     General: There is no distension.     Palpations: Abdomen is soft.     Tenderness: There is no abdominal tenderness.     Comments: Staples removed and steri strips placed, no erythema or drainage  Neurological:     Mental Status: She is alert.     Assessment:  50 y.o. yo Female s/p laparoscopic left hemicolectomy for benign polyps on pathology.  Plan:  No heavy lifting > 10 lbs, excessive bending, pushing, pulling, or squatting for 6-8 weeks after surgery.   Steri strips will fall off/ peel up in 5-7 days. Ok to shower. Ok to remove once peeling up.  Diet as tolerated. Colonoscopy per GI.  Keep stools soft and regular/ stool softener/ Fiber etc.   PRN follow up   Curlene Labrum, MD [facility] [address] [place], [place] [address] [phone] (office)

## 2021-02-28 NOTE — Progress Notes (Signed)
Chief Complaint  Patient presents with  . Gynecologic Exam  . Vaginal Bleeding    Hasnt stopped bleeding since 3/14, no abnormal pain, heavy flow, lots of clotting     HPI:      [name] is a 50 y.o. No obstetric history on file. who LMP was Patient's last menstrual period was 01/16/2021 (exact date)., presents today for her annual examination.  Her menses are regular every month, mod flow for 2-3 days.  Dysmenorrhea mild, occurring first 1-2 days of flow. She does not have intermenstrual bleeding usually. She started 3/22 menses on time and bleeding has persisted. Flow is daily, mod to heavier flow than usual menses, with clots. Min cramping compated to normal menses. She does have vasomotor sx. Pt with recent colonoscopy 3/22 with 2 large polyps; s/p colectomy 4/22.    Sex activity: not sexually active due to no desire/libido. Sx worse since birth of youngest daughter, almost 5 yrs ago. Recommended awakenings in [place] in past.  Last Pap: 03/15/17  Results were: no abnormalities /neg HPV DNA  Hx of STDs: HPV  Last mammogram: 02/01/21 Results were normal, repeat in 12 months. There is a FH of breast cancer in her mom, genetic testing not indicated. There is no FH of ovarian cancer. The patient does self-breast exams.  Tobacco use: 1/2 ppd for many yrs; trying to cut down Alcohol use: none  No drug use Exercise: moderately active  She does get adequate calcium but not Vitamin D in her diet. Labs with PCP.   Colonoscopy: 3/22 with 2 large polyps, s/p partial colectomy 02/08/21. Recovering well. Pt with hx of colon polyps on colonoscopy in [place] 12 yrs ago.   Past Medical History:  Diagnosis Date  . Colon polyps   . Decreased libido   . Family history of breast cancer     Past Surgical History:  Procedure Laterality Date  . BIOPSY  01/09/2021   Procedure: BIOPSY;  Surgeon: Eloise Harman, DO;  [facility];  Service: Endoscopy;;  . CHOLECYSTECTOMY, LAPAROSCOPIC     . COLONOSCOPY WITH PROPOFOL N/A 01/09/2021   Procedure: COLONOSCOPY WITH PROPOFOL;  Surgeon: Eloise Harman, DO;  [facility]: [facility];  Service: Endoscopy;  Laterality: N/A;  ASA I / AM procedure (pt prefers later morning procedure if possible)  . LAPAROSCOPIC PARTIAL COLECTOMY N/A 02/08/2021   Procedure: LAPAROSCOPIC PARTIAL COLECTOMY;  Surgeon: Virl Cagey, MD;  Location: [facility];  Service: General;  Laterality: N/A;  . POLYPECTOMY  01/09/2021   Procedure: POLYPECTOMY;  Surgeon: Eloise Harman, DO;  [facility];  Service: Endoscopy;;  . TUBAL LIGATION    . TUBAL LIGATION  2008   reversal  . tubal ligation reversal    . VAGINAL DELIVERY      Family History  Problem Relation Age of Onset  . Breast cancer Mother 50       cured  . Fibroids Mother   . Ovarian cancer Neg Hx     Social History   Socioeconomic History  . Marital status: Married    Spouse name: Not on file  . Number of children: Not on file  . Years of education: Not on file  . Highest education level: Not on file  Occupational History  . Not on file  Tobacco Use  . Smoking status: Current Every Day Smoker    Packs/day: 1.00    Types: Cigarettes  . Smokeless tobacco: Never Used  Vaping  Use  . Vaping Use: Never used  Substance and Sexual Activity  . Alcohol use: No  . Drug use: No  . Sexual activity: Not Currently    Birth control/protection: Surgical    Comment: vasectomy  Other Topics Concern  . Not on file  Social History Narrative  . Not on file   Social Determinants of Health   Financial Resource Strain: Not on file  Food Insecurity: Not on file  Transportation Needs: Not on file  Physical Activity: Not on file  Stress: Not on file  Social Connections: Not on file  Intimate Partner Violence: Not on file     Current Outpatient Medications:  .  norethindrone (AYGESTIN) 5 MG tablet, Take 1 tablet (5 mg total) by mouth daily for 10 days., Disp: 10 tablet, Rfl:  0  ROS:  Review of Systems  Constitutional: Negative for fatigue, fever and unexpected weight change.  Respiratory: Negative for cough, shortness of breath and wheezing.   Cardiovascular: Negative for chest pain, palpitations and leg swelling.  Gastrointestinal: Negative for blood in stool, constipation, diarrhea, nausea and vomiting.  Endocrine: Negative for cold intolerance, heat intolerance and polyuria.  Genitourinary: Positive for menstrual problem. Negative for dyspareunia, dysuria, flank pain, frequency, genital sores, hematuria, pelvic pain, urgency, vaginal bleeding, vaginal discharge and vaginal pain.  Musculoskeletal: Negative for back pain, joint swelling and myalgias.  Skin: Negative for rash.  Neurological: Negative for dizziness, syncope, light-headedness, numbness and headaches.  Hematological: Negative for adenopathy.  Psychiatric/Behavioral: Negative for agitation, confusion, sleep disturbance and suicidal ideas. The patient is not nervous/anxious.     Objective: BP 120/76   Ht 5\' 6"  (1.676 m)   Wt 146 lb (66.2 kg)   LMP 01/16/2021 (Exact Date)   BMI 23.57 kg/m    Physical Exam Constitutional:      Appearance: She is well-developed.  Genitourinary:     Vulva normal.     Right Labia: No rash, tenderness or lesions.    Left Labia: No tenderness, lesions or rash.    Vaginal bleeding present.     No vaginal discharge, erythema or tenderness.      Right Adnexa: not tender and no mass present.    Left Adnexa: not tender and no mass present.    No cervical motion tenderness, friability or polyp.     Uterus is tender.     Uterus is not enlarged.     Uterus exam comments: HARD TO PALPATE UTERUS DUE TO TENDERNESS FROM COLECTOMY 4/22.  Breasts:     Right: No mass, nipple discharge, skin change or tenderness.     Left: No mass, nipple discharge, skin change or tenderness.    Neck:     Thyroid: No thyromegaly.  Cardiovascular:     Rate and Rhythm: Normal rate  and regular rhythm.     Heart sounds: Normal heart sounds. No murmur heard.   Pulmonary:     Effort: Pulmonary effort is normal.     Breath sounds: Normal breath sounds.  Abdominal:     Palpations: Abdomen is soft.     Tenderness: There is abdominal tenderness. There is no guarding or rebound.  Musculoskeletal:        General: Normal range of motion.     Cervical back: Normal range of motion.  Lymphadenopathy:     Cervical: No cervical adenopathy.  Neurological:     General: No focal deficit present.     Mental Status: She is alert and oriented to person, place,  and time.     Cranial Nerves: No cranial nerve deficit.  Skin:    General: Skin is warm and dry.  Psychiatric:        Mood and Affect: Mood normal.        Behavior: Behavior normal.        Thought Content: Thought content normal.        Judgment: Judgment normal.  Vitals reviewed.     Assessment/Plan: Encounter for annual routine gynecological examination  Cervical cancer screening - Plan: Cytology - PAP  Screening for HPV (human papillomavirus) - Plan: Cytology - PAP  Abnormal uterine bleeding (AUB) - Plan: US PELVIC COMPLETE WITH TRANSVAGINAL, norethindrone (AYGESTIN) 5 MG tablet; for 1 month, most likely due to stress of surgery. Check pap and GYN u/s, will f/u with results. Rx aygestin in meantime. F/u prn since u/s will be a few wks out due to u/s shortage.   Mass of colon--doing well s/p colectomy.   Meds ordered this encounter  Medications  . norethindrone (AYGESTIN) 5 MG tablet    Sig: Take 1 tablet (5 mg total) by mouth daily for 10 days.    Dispense:  10 tablet    Refill:  0    Order Specific Question:   Supervising Provider    Answer:   Gae Dry [[id number]]              GYN counsel mammography screening, adequate intake of calcium and vitamin D, diet and exercise     F/U  Return in about 1 year (around 03/01/2022), or if symptoms worsen or fail to improve, for annual.  Birttany Dechellis B.  Adelynne Joerger, PA-C 03/01/2021 9:16 AM

## 2021-02-28 NOTE — Patient Instructions (Signed)
I value your feedback and you entrusting us with your care. If you get a [facility] patient survey, I would appreciate you taking the time to let us know about your experience today. Thank you! ? ? ?

## 2021-03-01 ENCOUNTER — Other Ambulatory Visit (HOSPITAL_COMMUNITY)
Admission: RE | Admit: 2021-03-01 | Discharge: 2021-03-01 | Disposition: A | Discharge status: Home / Self Care | Payer: Medicaid Other | Source: Ambulatory Visit | Attending: Obstetrics and Gynecology | Admitting: Obstetrics and Gynecology

## 2021-03-01 ENCOUNTER — Ambulatory Visit (INDEPENDENT_AMBULATORY_CARE_PROVIDER_SITE_OTHER): Payer: Medicaid Other | Admitting: Obstetrics and Gynecology

## 2021-03-01 ENCOUNTER — Encounter: Payer: Self-pay | Admitting: Obstetrics and Gynecology

## 2021-03-01 ENCOUNTER — Other Ambulatory Visit: Payer: Self-pay

## 2021-03-01 VITALS — BP 120/76 | Ht 66.0 in | Wt 146.0 lb

## 2021-03-01 DIAGNOSIS — Z1151 Encounter for screening for human papillomavirus (HPV): Secondary | ICD-10-CM | POA: Diagnosis present

## 2021-03-01 DIAGNOSIS — N939 Abnormal uterine and vaginal bleeding, unspecified: Secondary | ICD-10-CM | POA: Diagnosis not present

## 2021-03-01 DIAGNOSIS — Z01419 Encounter for gynecological examination (general) (routine) without abnormal findings: Secondary | ICD-10-CM

## 2021-03-01 DIAGNOSIS — Z124 Encounter for screening for malignant neoplasm of cervix: Secondary | ICD-10-CM

## 2021-03-01 DIAGNOSIS — K6389 Other specified diseases of intestine: Secondary | ICD-10-CM

## 2021-03-01 DIAGNOSIS — Z803 Family history of malignant neoplasm of breast: Secondary | ICD-10-CM

## 2021-03-01 MED ORDER — NORETHINDRONE ACETATE 5 MG PO TABS
5.0000 mg | ORAL_TABLET | Freq: Every day | ORAL | 0 refills | Status: DC
Start: 2021-03-01 — End: 2021-11-28

## 2021-03-09 LAB — CYTOLOGY - PAP
Comment: NEGATIVE
Diagnosis: NEGATIVE
Diagnosis: REACTIVE
High risk HPV: NEGATIVE

## 2021-03-27 ENCOUNTER — Ambulatory Visit: Payer: Medicaid Other | Admitting: Obstetrics and Gynecology

## 2021-04-04 ENCOUNTER — Emergency Department (HOSPITAL_COMMUNITY): Payer: Medicaid Other

## 2021-04-04 ENCOUNTER — Emergency Department (HOSPITAL_COMMUNITY)
Admission: EM | Admit: 2021-04-04 | Discharge: 2021-04-04 | Disposition: A | Discharge status: Home / Self Care | Payer: Medicaid Other | Attending: Emergency Medicine | Admitting: Emergency Medicine

## 2021-04-04 ENCOUNTER — Other Ambulatory Visit: Payer: Self-pay

## 2021-04-04 DIAGNOSIS — E876 Hypokalemia: Secondary | ICD-10-CM | POA: Insufficient documentation

## 2021-04-04 DIAGNOSIS — F17210 Nicotine dependence, cigarettes, uncomplicated: Secondary | ICD-10-CM | POA: Insufficient documentation

## 2021-04-04 DIAGNOSIS — D649 Anemia, unspecified: Secondary | ICD-10-CM | POA: Insufficient documentation

## 2021-04-04 DIAGNOSIS — R1011 Right upper quadrant pain: Secondary | ICD-10-CM | POA: Diagnosis not present

## 2021-04-04 LAB — COMPREHENSIVE METABOLIC PANEL
ALT: 7 U/L (ref 0–44)
AST: 10 U/L — ABNORMAL LOW (ref 15–41)
Albumin: 3.8 g/dL (ref 3.5–5.0)
Alkaline Phosphatase: 85 U/L (ref 38–126)
Anion gap: 5 (ref 5–15)
BUN: 12 mg/dL (ref 6–20)
CO2: 26 mmol/L (ref 22–32)
Calcium: 8.3 mg/dL — ABNORMAL LOW (ref 8.9–10.3)
Chloride: 100 mmol/L (ref 98–111)
Creatinine, Ser: 0.71 mg/dL (ref 0.44–1.00)
GFR, Estimated: >60 mL/min (ref >60)
Glucose, Bld: 105 mg/dL — ABNORMAL HIGH (ref 70–99)
Potassium: 2.8 mmol/L — ABNORMAL LOW (ref 3.5–5.1)
Sodium: 131 mmol/L — ABNORMAL LOW (ref 135–145)
Total Bilirubin: 0.4 mg/dL (ref 0.3–1.2)
Total Protein: 7.5 g/dL (ref 6.5–8.1)

## 2021-04-04 LAB — URINALYSIS, ROUTINE W REFLEX MICROSCOPIC
Bilirubin Urine: NEGATIVE
Glucose, UA: NEGATIVE mg/dL
Ketones, ur: NEGATIVE mg/dL
Leukocytes,Ua: NEGATIVE
Nitrite: NEGATIVE
Protein, ur: NEGATIVE mg/dL
Specific Gravity, Urine: 1.003 — ABNORMAL LOW (ref 1.005–1.030)
pH: 6 (ref 5.0–8.0)

## 2021-04-04 LAB — CBC WITH DIFFERENTIAL/PLATELET
Abs Immature Granulocytes: 0 10*3/uL (ref 0.00–0.07)
Basophils Absolute: 0 10*3/uL (ref 0.0–0.1)
Basophils Relative: 1 %
Eosinophils Absolute: 0 10*3/uL (ref 0.0–0.5)
Eosinophils Relative: 1 %
HCT: 24.5 % — ABNORMAL LOW (ref 36.0–46.0)
Hemoglobin: 7.2 g/dL — ABNORMAL LOW (ref 12.0–15.0)
Immature Granulocytes: 0 %
Lymphocytes Relative: 24 %
Lymphs Abs: 1.2 10*3/uL (ref 0.7–4.0)
MCH: 23.8 pg — ABNORMAL LOW (ref 26.0–34.0)
MCHC: 29.4 g/dL — ABNORMAL LOW (ref 30.0–36.0)
MCV: 80.9 fL (ref 80.0–100.0)
Monocytes Absolute: 0.3 10*3/uL (ref 0.1–1.0)
Monocytes Relative: 5 %
Neutro Abs: 3.5 10*3/uL (ref 1.7–7.7)
Neutrophils Relative %: 69 %
Platelets: 274 10*3/uL (ref 150–400)
RBC: 3.03 MIL/uL — ABNORMAL LOW (ref 3.87–5.11)
RDW: 18.5 % — ABNORMAL HIGH (ref 11.5–15.5)
WBC: 5 10*3/uL (ref 4.0–10.5)
nRBC: 0 % (ref 0.0–0.2)

## 2021-04-04 LAB — TYPE AND SCREEN
ABO/RH(D): A POS
Antibody Screen: NEGATIVE

## 2021-04-04 LAB — LIPASE, BLOOD: Lipase: 31 U/L (ref 11–51)

## 2021-04-04 MED ORDER — POTASSIUM CHLORIDE CRYS ER 20 MEQ PO TBCR
40.0000 meq | EXTENDED_RELEASE_TABLET | Freq: Once | ORAL | Status: AC
  Administered 2021-04-04: 40 meq via ORAL
  Filled 2021-04-04: qty 2

## 2021-04-04 MED ORDER — POTASSIUM CHLORIDE 10 MEQ/100ML IV SOLN
10.0000 meq | Freq: Once | INTRAVENOUS | Status: AC
  Administered 2021-04-04: 10 meq via INTRAVENOUS
  Filled 2021-04-04: qty 100

## 2021-04-04 MED ORDER — PRENATAL VITAMINS 28-0.8 MG PO TABS: 1.0000 | ORAL_TABLET | Freq: Every day | ORAL | 0 refills | Status: DC

## 2021-04-04 MED ORDER — POTASSIUM CHLORIDE CRYS ER 20 MEQ PO TBCR: 20.0000 meq | EXTENDED_RELEASE_TABLET | Freq: Two times a day (BID) | ORAL | 0 refills | Status: DC

## 2021-04-04 NOTE — ED Provider Notes (Signed)
[facility] EMERGENCY DEPARTMENT Provider Note   CSN: [id number] Arrival date & time: 04/04/21  0865     History Chief Complaint  Patient presents with  . Abdominal Pain    Felicia Huff is a 50 y.o. female.  Patient is 6 to 8 weeks postop Dr.Bridges did a left partial colectomy.  For tubular adenoma.  All lymph nodes and margins were negative.  Patient was admitted for this April 6 through April 10.  Patient presenting with a complaint of some abdominal pain more right-sided abdomen right upper quadrant decreased appetite.  Some nausea but no vomiting no diarrhea.  Patient has had anemia in the past due to heavy vaginal bleeding.  Patient received medication to stop that after the surgery.  And has had no further had no further bleeding.  Patient is not pale today.  Patient is followed by [facility] family medicine.  Patient denies any blood in her bowel movements.        Past Medical History:  Diagnosis Date  . Colon polyps   . Decreased libido   . Family history of breast cancer     Patient Active Problem List   Diagnosis Date Noted  . Colonic mass 02/08/2021  . Mass of colon 01/30/2021  . Family history of breast cancer 03/15/2017  . Decreased libido 03/15/2017    Past Surgical History:  Procedure Laterality Date  . BIOPSY  01/09/2021   Procedure: BIOPSY;  Surgeon: Eloise Harman, DO;  [facility];  Service: Endoscopy;;  . CHOLECYSTECTOMY, LAPAROSCOPIC    . COLONOSCOPY WITH PROPOFOL N/A 01/09/2021   Procedure: COLONOSCOPY WITH PROPOFOL;  Surgeon: Eloise Harman, DO;  [facility];  Service: Endoscopy;  Laterality: N/A;  ASA I / AM procedure (pt prefers later morning procedure if possible)  . LAPAROSCOPIC PARTIAL COLECTOMY N/A 02/08/2021   Procedure: LAPAROSCOPIC PARTIAL COLECTOMY;  Surgeon: Virl Cagey, MD;  Location: [facility];  Service: General;  Laterality: N/A;  . POLYPECTOMY  01/09/2021   Procedure: POLYPECTOMY;  Surgeon:  Eloise Harman, DO;  [facility];  Service: Endoscopy;;  . TUBAL LIGATION    . TUBAL LIGATION  2008   reversal  . tubal ligation reversal    . VAGINAL DELIVERY       OB History    Gravida  6   Para  6   Term  6   Preterm      AB      Living  6     SAB      IAB      Ectopic      Multiple      Live Births  6           Family History  Problem Relation Age of Onset  . Breast cancer Mother 12       cured  . Fibroids Mother   . Ovarian cancer Neg Hx     Social History   Tobacco Use  . Smoking status: Current Every Day Smoker    Packs/day: 1.00    Types: Cigarettes  . Smokeless tobacco: Never Used  Vaping Use  . Vaping Use: Never used  Substance Use Topics  . Alcohol use: No  . Drug use: No    Home Medications Prior to Admission medications   Medication Sig Start Date End Date Taking? Authorizing Provider  potassium chloride SA (KLOR-CON) 20 MEQ tablet Take 1 tablet (20 mEq total) by mouth 2 (  two) times daily. 04/04/21  Yes Fredia Sorrow, MD  Prenatal Vit-Fe Fumarate-FA (PRENATAL VITAMINS) 28-0.8 MG TABS Take 1 tablet by mouth daily. 04/04/21  Yes Fredia Sorrow, MD  norethindrone (AYGESTIN) 5 MG tablet Take 1 tablet (5 mg total) by mouth daily for 10 days. 03/01/21 04/05/94  Copland, Deirdre Evener, PA-C    Allergies    Patient has no known allergies.  Review of Systems   Review of Systems  Constitutional: Positive for appetite change. Negative for chills and fever.  HENT: Negative for congestion, rhinorrhea and sore throat.   Eyes: Negative for visual disturbance.  Respiratory: Negative for cough and shortness of breath.   Cardiovascular: Negative for chest pain and leg swelling.  Gastrointestinal: Positive for abdominal pain and nausea. Negative for diarrhea and vomiting.  Genitourinary: Negative for dysuria.  Musculoskeletal: Negative for back pain and neck pain.  Skin: Negative for rash.  Neurological: Negative for dizziness,  light-headedness and headaches.  Hematological: Does not bruise/bleed easily.  Psychiatric/Behavioral: Negative for confusion.    Physical Exam Updated Vital Signs BP 137/84 (BP Location: Right Arm)   Pulse 84   Temp 98.4 F (36.9 C) (Oral)   Resp 16   Ht 1.676 m (5\' 6" )   Wt 70.8 kg   SpO2 100%   BMI 25.18 kg/m   Physical Exam Vitals and nursing note reviewed.  Constitutional:      General: She is not in acute distress.    Appearance: Normal appearance. She is well-developed. She is not ill-appearing.  HENT:     Head: Normocephalic and atraumatic.     Mouth/Throat:     Mouth: Mucous membranes are moist.  Eyes:     General: No scleral icterus.    Extraocular Movements: Extraocular movements intact.     Conjunctiva/sclera: Conjunctivae normal.     Pupils: Pupils are equal, round, and reactive to light.  Cardiovascular:     Rate and Rhythm: Normal rate and regular rhythm.     Heart sounds: No murmur heard.   Pulmonary:     Effort: Pulmonary effort is normal. No respiratory distress.     Breath sounds: Normal breath sounds.  Abdominal:     Palpations: Abdomen is soft.     Tenderness: There is no abdominal tenderness.     Comments: Surgical scars are well-healed.  Abdomen nontender not distended.  Musculoskeletal:        General: No swelling.     Cervical back: Normal range of motion and neck supple.  Skin:    General: Skin is warm and dry.     Coloration: Skin is not pale.  Neurological:     General: No focal deficit present.     Mental Status: She is alert and oriented to person, place, and time.     ED Results / Procedures / Treatments   Labs (all labs ordered are listed, but only abnormal results are displayed) Labs Reviewed  COMPREHENSIVE METABOLIC PANEL - Abnormal; Notable for the following components:      Result Value   Sodium 131 (*)    Potassium 2.8 (*)    Glucose, Bld 105 (*)    Calcium 8.3 (*)    AST 10 (*)    All other components within  normal limits  CBC WITH DIFFERENTIAL/PLATELET - Abnormal; Notable for the following components:   RBC 3.03 (*)    Hemoglobin 7.2 (*)    HCT 24.5 (*)    MCH 23.8 (*)    MCHC 29.4 (*)  RDW 18.5 (*)    All other components within normal limits  URINALYSIS, ROUTINE W REFLEX MICROSCOPIC - Abnormal; Notable for the following components:   Color, Urine STRAW (*)    APPearance HAZY (*)    Specific Gravity, Urine 1.003 (*)    Hgb urine dipstick SMALL (*)    Bacteria, UA RARE (*)    All other components within normal limits  LIPASE, BLOOD  TYPE AND SCREEN    EKG None  Radiology CT Abdomen Pelvis Wo Contrast  Result Date: 04/04/2021 CLINICAL DATA:  Acute nonlocalized right-sided abdominal pain. Nausea. One week duration. Bowel resection 6 weeks ago. EXAM: CT ABDOMEN AND PELVIS WITHOUT CONTRAST TECHNIQUE: Multidetector CT imaging of the abdomen and pelvis was performed following the standard protocol without IV contrast. COMPARISON:  10/26/2014 FINDINGS: Lower chest: Minimal basilar pulmonary scarring. Hepatobiliary: Previous cholecystectomy. Liver parenchyma appears normal without contrast. Pancreas: Normal Spleen: Normal Adrenals/Urinary Tract: Adrenal glands are normal. Left kidney is normal. Right kidney shows approximately 5 or 6 small stones within the kidney, all about 2 mm in size. There is mild fullness of the renal collecting system and ureter. No stone is seen in the ureter or bladder. The patient may have recently passed a small stone. Stomach/Bowel: Previous resection in the mid to left colon region. No complication is seen in the region of the anastomosis or elsewhere within the peritoneal space. Vascular/Lymphatic: Aortic atherosclerosis.  No adenopathy. Reproductive: Normal.  No pelvic mass. Other: No free fluid or air. Musculoskeletal: Minimal lumbar degenerative changes. IMPRESSION: No abnormality seen in the region of bowel resection and anastomosis. Multiple 2 mm stones in the  right kidney. Fullness of the right renal collecting system and ureter, without a visible passing stone. No stone in the bladder. Patient may have recently passed a small stone on the right. Electronically Signed   By: Nelson Chimes M.D.   On: 04/04/2021 12:50    Procedures Procedures     Medications Ordered in ED Medications  potassium chloride SA (KLOR-CON) CR tablet 40 mEq (has no administration in time range)  potassium chloride 10 mEq in 100 mL IVPB (has no administration in time range)    ED Course  I have reviewed the triage vital signs and the nursing notes.  Pertinent labs & imaging results that were available during my care of the patient were reviewed by me and considered in my medical decision making (see chart for details).    MDM Rules/Calculators/A&P                          Work-up here CT abdomen without any acute findings.  No abnormalities.  Other than the fact that raise concerns for recent passage of a kidney stone.  Patient's urinalysis did have 6-10 red blood cells.  But no evidence of infection.  Complete metabolic panel significant for potassium of 2.8.  CO2 was 26.  Renal function normal.  Liver function test without any acute abnormalities.  Lipase normal.  CBC no leukocytosis.  However hemoglobin 7.2.  Post surgery it was 8.1.  Prior to that it had been 7.5.  Patient did not receive a blood transfusion.  Platelets are normal.  For the low potassium patient will receive 10 mg potassium IV and then will also receive 40 mEq p.o.  She will be discharged home with some oral potassium.  Patient not concerned about the hemoglobin.  Wants to follow-up with [facility] family medicine.  To  check that again as well as to check her potassium.  Patient nontoxic no acute distress.  Certainly stable for discharge.  No hypotension.  Patient will be also following back up with Dr. Constance Haw      Final Clinical Impression(s) / ED Diagnoses Final diagnoses:  Right upper  quadrant abdominal pain  Anemia, unspecified type  Hypokalemia    Rx / DC Orders ED Discharge Orders         Ordered    potassium chloride SA (KLOR-CON) 20 MEQ tablet  2 times daily        04/04/21 1450    Prenatal Vit-Fe Fumarate-FA (PRENATAL VITAMINS) 28-0.8 MG TABS  Daily        04/04/21 1450           Fredia Sorrow, MD 04/04/21 1451

## 2021-04-04 NOTE — ED Triage Notes (Signed)
Pt is 6-8 weeks post-op, Dr Constance Haw removed 18 inches of colon.  Here today d/t pain and decreased appetite.

## 2021-04-04 NOTE — Discharge Instructions (Addendum)
Follow back up with your primary care doctor to have your potassium rechecked and hemoglobin rechecked.  CT scan of your abdomen without any acute findings regarding the surgical aspects.  Take the oral potassium as prescribed.  Take the prenatal vitamins as prescribed for iron.  Also make an appointment to follow back up with your surgeon.  Return for any new or worse symptoms.

## 2021-04-25 ENCOUNTER — Telehealth: Payer: Self-pay | Admitting: Obstetrics and Gynecology

## 2021-04-25 NOTE — Telephone Encounter (Signed)
Pt aware of neg GYN u/s 04/19/21 for AUB from 4/22. AUB resolved with aygestin tx 4/22 and pt has had a normal period since. Will f/u prn sx.   U/S showed incidental finding of 1.5 cm LTO complex cyst; f/u u/s recommended in 1 yr. Will address at 4/23 annual.

## 2021-11-14 ENCOUNTER — Other Ambulatory Visit (HOSPITAL_COMMUNITY): Payer: Self-pay | Admitting: Family Medicine

## 2021-11-14 ENCOUNTER — Telehealth: Payer: Self-pay | Admitting: Internal Medicine

## 2021-11-14 DIAGNOSIS — Z1231 Encounter for screening mammogram for malignant neoplasm of breast: Secondary | ICD-10-CM

## 2021-11-14 NOTE — Telephone Encounter (Signed)
Returned the pt's call and advised her that she wasn't scheduled for a follow-up in a year with Dr. Abbey Chatters. Pt expressed understanding

## 2021-11-14 NOTE — Telephone Encounter (Signed)
Pt said she was seen last year and we referred her to Dr Prentiss Bells Arnoldo Morale. She said that she thought Dr Abbey Chatters wanted her to follow up in a year, but wasn't sure. I see in the notes to have another colonoscopy in 2032. Please advise. [phone]

## 2021-11-28 ENCOUNTER — Ambulatory Visit (INDEPENDENT_AMBULATORY_CARE_PROVIDER_SITE_OTHER): Payer: Medicaid Other | Admitting: Obstetrics and Gynecology

## 2021-11-28 ENCOUNTER — Encounter: Payer: Self-pay | Admitting: Obstetrics and Gynecology

## 2021-11-28 ENCOUNTER — Other Ambulatory Visit: Payer: Self-pay

## 2021-11-28 VITALS — BP 130/70 | Ht 66.0 in | Wt 154.0 lb

## 2021-11-28 DIAGNOSIS — R350 Frequency of micturition: Secondary | ICD-10-CM | POA: Diagnosis not present

## 2021-11-28 DIAGNOSIS — N898 Other specified noninflammatory disorders of vagina: Secondary | ICD-10-CM | POA: Diagnosis not present

## 2021-11-28 LAB — POCT URINALYSIS DIPSTICK
Bilirubin, UA: NEGATIVE
Blood, UA: NEGATIVE
Glucose, UA: NEGATIVE
Ketones, UA: NEGATIVE
Leukocytes, UA: NEGATIVE
Nitrite, UA: NEGATIVE
Protein, UA: NEGATIVE
Spec Grav, UA: 1.01 (ref 1.010–1.025)
pH, UA: 6 (ref 5.0–8.0)

## 2021-11-28 LAB — POCT WET PREP WITH KOH
Clue Cells Wet Prep HPF POC: NEGATIVE
KOH Prep POC: NEGATIVE
Trichomonas, UA: NEGATIVE
Yeast Wet Prep HPF POC: NEGATIVE

## 2021-11-28 MED ORDER — CLOTRIMAZOLE-BETAMETHASONE 1-0.05 % EX CREA: TOPICAL_CREAM | CUTANEOUS | 0 refills | Status: DC

## 2021-11-28 NOTE — Progress Notes (Signed)
Zhou-Talbert, Elwyn Lade, MD   Chief Complaint  Patient presents with   Vaginal Itching    Irritation, no discharge or odor x 1 week   Urinary Tract Infection    Frequency, no burning     HPI:      [name] is a 51 y.o. [phone] whose LMP was Patient's last menstrual period was 11/14/2021 (approximate)., presents today for vaginal itching and irritation, mostly around clitoral area/superior labia; no d/c or fishy odor, for past week. No meds to treat. Seems to have yeast vag sx after menses every few months and treats with OTC yeasts meds with sx relief. Wants to be eval this time given recurrence sx. Uses zest or irish spring soap, no dryer sheets, using cottonelle wipes occas. Also with urinary frequency with good flow (has increased water intake). No hematuria/dysuria, no LBP, pelvic pain, fevers. No recent abx use.   Patient Active Problem List   Diagnosis Date Noted   Colonic mass 02/08/2021   Mass of colon 01/30/2021   Family history of breast cancer 03/15/2017   Decreased libido 03/15/2017    Past Surgical History:  Procedure Laterality Date   BIOPSY  01/09/2021   Procedure: BIOPSY;  Surgeon: Eloise Harman, DO;  [facility];  Service: Endoscopy;;   CHOLECYSTECTOMY, LAPAROSCOPIC     COLONOSCOPY WITH PROPOFOL N/A 01/09/2021   Procedure: COLONOSCOPY WITH PROPOFOL;  Surgeon: Eloise Harman, DO;  [facility];  Service: Endoscopy;  Laterality: N/A;  ASA I / AM procedure (pt prefers later morning procedure if possible)   LAPAROSCOPIC PARTIAL COLECTOMY N/A 02/08/2021   Procedure: LAPAROSCOPIC PARTIAL COLECTOMY;  Surgeon: Virl Cagey, MD;  Location: [facility];  Service: General;  Laterality: N/A;   POLYPECTOMY  01/09/2021   Procedure: POLYPECTOMY;  Surgeon: Eloise Harman, DO;  [facility];  Service: Endoscopy;;   TUBAL LIGATION     TUBAL LIGATION  2008   reversal   tubal ligation reversal     VAGINAL DELIVERY      Family  History  Problem Relation Age of Onset   Breast cancer Mother 52       cured   Fibroids Mother    Ovarian cancer Neg Hx     Social History   Socioeconomic History   Marital status: Married    Spouse name: Not on file   Number of children: Not on file   Years of education: Not on file   Highest education level: Not on file  Occupational History   Not on file  Tobacco Use   Smoking status: Every Day    Packs/day: 1.00    Types: Cigarettes   Smokeless tobacco: Never  Vaping Use   Vaping Use: Never used  Substance and Sexual Activity   Alcohol use: No   Drug use: No   Sexual activity: Not Currently    Birth control/protection: Surgical    Comment: vasectomy  Other Topics Concern   Not on file  Social History Narrative   Not on file   Social Determinants of Health   Financial Resource Strain: Not on file  Food Insecurity: Not on file  Transportation Needs: Not on file  Physical Activity: Not on file  Stress: Not on file  Social Connections: Not on file  Intimate Partner Violence: Not on file    Outpatient Medications Prior to Visit  Medication Sig Dispense Refill   norethindrone (AYGESTIN) 5 MG tablet Take 1  tablet (5 mg total) by mouth daily for 10 days. 10 tablet 0   potassium chloride SA (KLOR-CON) 20 MEQ tablet Take 1 tablet (20 mEq total) by mouth 2 (two) times daily. 6 tablet 0   Prenatal Vit-Fe Fumarate-FA (PRENATAL VITAMINS) 28-0.8 MG TABS Take 1 tablet by mouth daily. 30 tablet 0   No facility-administered medications prior to visit.      ROS:  Review of Systems  Constitutional:  Negative for fever.  Gastrointestinal:  Negative for blood in stool, constipation, diarrhea, nausea and vomiting.  Genitourinary:  Positive for frequency. Negative for dyspareunia, dysuria, flank pain, hematuria, urgency, vaginal bleeding, vaginal discharge and vaginal pain.  Musculoskeletal:  Negative for back pain.  Skin:  Negative for rash.  BREAST: No  symptoms   OBJECTIVE:   Vitals:  BP 130/70    Ht 5\' 6"  (1.676 m)    Wt 154 lb (69.9 kg)    LMP 11/14/2021 (Approximate)    BMI 24.86 kg/m   Physical Exam Vitals reviewed.  Constitutional:      Appearance: She is well-developed.  Pulmonary:     Effort: Pulmonary effort is normal.  Genitourinary:    General: Normal vulva.     Pubic Area: No rash.      Labia:        Right: No rash, tenderness or lesion.        Left: No rash, tenderness or lesion.      Vagina: Normal. No vaginal discharge, erythema or tenderness.     Cervix: Normal.     Uterus: Normal. Not enlarged and not tender.      Adnexa: Right adnexa normal and left adnexa normal.       Right: No mass or tenderness.         Left: No mass or tenderness.      Musculoskeletal:        General: Normal range of motion.     Cervical back: Normal range of motion.  Skin:    General: Skin is warm and dry.  Neurological:     General: No focal deficit present.     Mental Status: She is alert and oriented to person, place, and time.  Psychiatric:        Mood and Affect: Mood normal.        Behavior: Behavior normal.        Thought Content: Thought content normal.        Judgment: Judgment normal.    Results: Results for orders placed or performed in visit on 11/28/21 (from the past 24 hour(s))  POCT Wet Prep with KOH     Status: Normal   Collection Time: 11/28/21  2:03 PM  Result Value Ref Range   Trichomonas, UA Negative    Clue Cells Wet Prep HPF POC neg    Epithelial Wet Prep HPF POC     Yeast Wet Prep HPF POC neg    Bacteria Wet Prep HPF POC     RBC Wet Prep HPF POC     WBC Wet Prep HPF POC     KOH Prep POC Negative Negative  POCT Urinalysis Dipstick     Status: Normal   Collection Time: 11/28/21  2:20 PM  Result Value Ref Range   Color, UA yellow    Clarity, UA clear    Glucose, UA Negative Negative   Bilirubin, UA neg    Ketones, UA neg    Spec Grav, UA 1.010 1.010 - 1.025  Blood, UA neg    pH, UA 6.0  5.0 - 8.0   Protein, UA Negative Negative   Urobilinogen, UA     Nitrite, UA neg    Leukocytes, UA Negative Negative   Appearance     Odor       Assessment/Plan: Vaginal itching - Plan: clotrimazole-betamethasone (LOTRISONE) cream, POCT Wet Prep with KOH, NuSwab Vaginitis (VG); neg exam and wet prep. Check culture. Rx lotrisone crm ext. Question chem derm due to soap. Change to dove sens skin soap, minimize cottonelle use. F/u prn.   Urinary frequency - Plan: POCT Urinalysis Dipstick; neg UA. Sx with increased water intake. F/u prn.    Meds ordered this encounter  Medications   clotrimazole-betamethasone (LOTRISONE) cream    Sig: Apply externally BID prn sx up to 2 wks    Dispense:  15 g    Refill:  0    Order Specific Question:   Supervising Provider    Answer:   Gae Dry [[id number]]      Return if symptoms worsen or fail to improve.  Hulon Ferron B. Micael Barb, PA-C 11/28/2021 2:22 PM

## 2021-12-01 LAB — NUSWAB VAGINITIS (VG)
Candida albicans, NAA: NEGATIVE
Candida glabrata, NAA: NEGATIVE
Trich vag by NAA: NEGATIVE

## 2021-12-20 ENCOUNTER — Encounter (HOSPITAL_COMMUNITY)
Admission: RE | Admit: 2021-12-20 | Discharge: 2021-12-20 | Disposition: A | Discharge status: Home / Self Care | Payer: Medicaid Other | Source: Ambulatory Visit | Attending: Family Medicine | Admitting: Family Medicine

## 2021-12-20 ENCOUNTER — Encounter (HOSPITAL_COMMUNITY): Payer: Self-pay

## 2021-12-20 DIAGNOSIS — D509 Iron deficiency anemia, unspecified: Secondary | ICD-10-CM | POA: Insufficient documentation

## 2021-12-20 MED ORDER — SODIUM CHLORIDE 0.9 % IV SOLN: Freq: Once | INTRAVENOUS | Status: AC

## 2021-12-20 MED ORDER — SODIUM CHLORIDE 0.9 % IV SOLN
510.0000 mg | Freq: Once | INTRAVENOUS | Status: AC
  Administered 2021-12-20: 510 mg via INTRAVENOUS
  Filled 2021-12-20: qty 510

## 2022-01-09 ENCOUNTER — Other Ambulatory Visit (HOSPITAL_BASED_OUTPATIENT_CLINIC_OR_DEPARTMENT_OTHER): Payer: Self-pay

## 2022-01-09 DIAGNOSIS — R5383 Other fatigue: Secondary | ICD-10-CM

## 2022-01-09 DIAGNOSIS — R0683 Snoring: Secondary | ICD-10-CM

## 2022-01-15 ENCOUNTER — Encounter: Payer: Self-pay | Admitting: Obstetrics and Gynecology

## 2022-01-15 ENCOUNTER — Other Ambulatory Visit: Payer: Self-pay

## 2022-01-15 ENCOUNTER — Ambulatory Visit (INDEPENDENT_AMBULATORY_CARE_PROVIDER_SITE_OTHER): Payer: Medicaid Other | Admitting: Obstetrics and Gynecology

## 2022-01-15 VITALS — BP 140/90 | Ht 66.0 in | Wt 154.0 lb

## 2022-01-15 DIAGNOSIS — Z1231 Encounter for screening mammogram for malignant neoplasm of breast: Secondary | ICD-10-CM | POA: Diagnosis not present

## 2022-01-15 DIAGNOSIS — Z01419 Encounter for gynecological examination (general) (routine) without abnormal findings: Secondary | ICD-10-CM

## 2022-01-15 DIAGNOSIS — N939 Abnormal uterine and vaginal bleeding, unspecified: Secondary | ICD-10-CM

## 2022-01-15 NOTE — Patient Instructions (Signed)
I value your feedback and you entrusting us with your care. If you get a [name] patient survey, I would appreciate you taking the time to let us know about your experience today. Thank you! ? ? ?

## 2022-01-15 NOTE — Progress Notes (Signed)
Chief Complaint  Patient presents with   Gynecologic Exam    No concerns     HPI:      [name] is a 51 y.o. No obstetric history on file. who LMP was Patient's last menstrual period was 01/01/2022 (approximate)., presents today for her annual examination.  Her menses are now every 2 wks to 2 months, lasting 7 days, mod flow, mild to mod dysmen, no meds taken. Pt states menses have always been regular but were monthly last yr and only lasting 3 days. Had 1 episode AUB last yr after colectomy, GYN u/s ordered but not done. Sx resolved. She does have vasomotor sx.    Sex activity: not sexually active due to no desire/libido. Sx worse since birth of youngest daughter. Recommended awakenings in [place] in past. No vag sx.  Last Pap: 03/01/21 Results were: no abnormalities /neg HPV DNA  Hx of STDs: HPV  Last mammogram: 02/01/21 Results were normal, repeat in 12 months. Has appt 4/23 There is a FH of breast cancer in her mom, genetic testing not indicated. There is no FH of ovarian cancer. The patient does self-breast exams.  Tobacco use: 1/2 ppd for many yrs; trying to cut down Alcohol use: none  No drug use Exercise: moderately active  She does get adequate calcium but not Vitamin D in her diet. Labs with PCP.   Colonoscopy: 3/22 with 2 large polyps, s/p partial colectomy 02/08/21. Repeat due after 10 yrs. Pt with hx of colon polyps on colonoscopy in [place] 12 yrs ago.   Past Medical History:  Diagnosis Date   Colon polyps    Decreased libido    Family history of breast cancer    Hypertension     Past Surgical History:  Procedure Laterality Date   BIOPSY  01/09/2021   Procedure: BIOPSY;  Surgeon: Eloise Harman, DO;  [facility];  Service: Endoscopy;;   CHOLECYSTECTOMY, LAPAROSCOPIC     COLONOSCOPY WITH PROPOFOL N/A 01/09/2021   Procedure: COLONOSCOPY WITH PROPOFOL;  Surgeon: Eloise Harman, DO;  [facility];  Service: Endoscopy;  Laterality: N/A;   ASA I / AM procedure (pt prefers later morning procedure if possible)   LAPAROSCOPIC PARTIAL COLECTOMY N/A 02/08/2021   Procedure: LAPAROSCOPIC PARTIAL COLECTOMY;  Surgeon: Virl Cagey, MD;  Location: [facility];  Service: General;  Laterality: N/A;   POLYPECTOMY  01/09/2021   Procedure: POLYPECTOMY;  Surgeon: Eloise Harman, DO;  [facility];  Service: Endoscopy;;   TUBAL LIGATION     TUBAL LIGATION  2008   reversal   tubal ligation reversal     VAGINAL DELIVERY      Family History  Problem Relation Age of Onset   Breast cancer Mother 48       cured   Fibroids Mother    Ovarian cancer Neg Hx     Social History   Socioeconomic History   Marital status: Married    Spouse name: Not on file   Number of children: Not on file   Years of education: Not on file   Highest education level: Not on file  Occupational History   Not on file  Tobacco Use   Smoking status: Every Day    Packs/day: 1.00    Types: Cigarettes   Smokeless tobacco: Never  Vaping Use   Vaping Use: Never used  Substance and Sexual Activity   Alcohol use: No   Drug use: No  Sexual activity: Not Currently    Birth control/protection: Surgical    Comment: vasectomy  Other Topics Concern   Not on file  Social History Narrative   Not on file   Social Determinants of Health   Financial Resource Strain: Not on file  Food Insecurity: Not on file  Transportation Needs: Not on file  Physical Activity: Not on file  Stress: Not on file  Social Connections: Not on file  Intimate Partner Violence: Not on file     Current Outpatient Medications:    losartan (COZAAR) 25 MG tablet, Take 12.5 mg by mouth daily., Disp: , Rfl:   ROS:  Review of Systems  Constitutional:  Negative for fatigue, fever and unexpected weight change.  Respiratory:  Negative for cough, shortness of breath and wheezing.   Cardiovascular:  Negative for chest pain, palpitations and leg swelling.  Gastrointestinal:   Negative for blood in stool, constipation, diarrhea, nausea and vomiting.  Endocrine: Negative for cold intolerance, heat intolerance and polyuria.  Genitourinary:  Positive for menstrual problem. Negative for dyspareunia, dysuria, flank pain, frequency, genital sores, hematuria, pelvic pain, urgency, vaginal bleeding, vaginal discharge and vaginal pain.  Musculoskeletal:  Negative for back pain, joint swelling and myalgias.  Skin:  Negative for rash.  Neurological:  Negative for dizziness, syncope, light-headedness, numbness and headaches.  Hematological:  Negative for adenopathy.  Psychiatric/Behavioral:  Negative for agitation, confusion, sleep disturbance and suicidal ideas. The patient is not nervous/anxious.    Objective: BP 140/90    Ht '5\' 6"'$  (1.676 m)    Wt 154 lb (69.9 kg)    LMP 01/01/2022 (Approximate)    BMI 24.86 kg/m    Physical Exam Constitutional:      Appearance: She is well-developed.  Genitourinary:     Vulva normal.     Right Labia: No rash, tenderness or lesions.    Left Labia: No tenderness, lesions or rash.    No vaginal discharge, erythema or tenderness.      Right Adnexa: not tender and no mass present.    Left Adnexa: not tender and no mass present.    No cervical friability or polyp.     Uterus is not enlarged or tender.  Breasts:    Right: No mass, nipple discharge, skin change or tenderness.     Left: No mass, nipple discharge, skin change or tenderness.  Neck:     Thyroid: No thyromegaly.  Cardiovascular:     Rate and Rhythm: Normal rate and regular rhythm.     Heart sounds: Normal heart sounds. No murmur heard. Pulmonary:     Effort: Pulmonary effort is normal.     Breath sounds: Normal breath sounds.  Abdominal:     Palpations: Abdomen is soft.     Tenderness: There is no abdominal tenderness. There is no guarding or rebound.  Musculoskeletal:        General: Normal range of motion.     Cervical back: Normal range of motion.   Lymphadenopathy:     Cervical: No cervical adenopathy.  Neurological:     General: No focal deficit present.     Mental Status: She is alert and oriented to person, place, and time.     Cranial Nerves: No cranial nerve deficit.  Skin:    General: Skin is warm and dry.  Psychiatric:        Mood and Affect: Mood normal.        Behavior: Behavior normal.  Thought Content: Thought content normal.        Judgment: Judgment normal.  Vitals reviewed.    Assessment/Plan: Encounter for annual routine gynecological examination  Encounter for screening mammogram for malignant neoplasm of breast; pt has appt scheduled  Abnormal uterine bleeding (AUB) - Plan: US PELVIS TRANSVAGINAL NON-OB (TV ONLY); check GYN u/s, will f/u with results. May be normal perimenopause for pt given long hx of irreg cycles per pt report.             GYN counsel mammography screening, adequate intake of calcium and vitamin D, diet and exercise     F/U  Return in about 2 weeks (around 01/29/2022) for GYN u/s at [facility] for AUB--[facility] to call pt.  Audryana Hockenberry B. Danyella Mcginty, PA-C 01/15/2022 10:42 AM

## 2022-01-25 ENCOUNTER — Other Ambulatory Visit: Payer: Self-pay

## 2022-01-25 ENCOUNTER — Ambulatory Visit (INDEPENDENT_AMBULATORY_CARE_PROVIDER_SITE_OTHER): Payer: Medicaid Other

## 2022-01-25 DIAGNOSIS — N939 Abnormal uterine and vaginal bleeding, unspecified: Secondary | ICD-10-CM | POA: Diagnosis not present

## 2022-01-30 ENCOUNTER — Telehealth: Payer: Self-pay | Admitting: Obstetrics and Gynecology

## 2022-01-30 DIAGNOSIS — N939 Abnormal uterine and vaginal bleeding, unspecified: Secondary | ICD-10-CM

## 2022-01-30 MED ORDER — NORETHINDRONE 0.35 MG PO TABS: 1.0000 | ORAL_TABLET | Freq: Every day | ORAL | 0 refills | Status: AC

## 2022-01-30 NOTE — Telephone Encounter (Signed)
Pt aware of GYN u/s results. Bleeding has stopped from LMP and has not had her period yet this month. Discussed watch and wait vs trial of POPs. Pt would like to try POPs. Hx of HTN and tob use. Rx camila eRxd. F/u in 3 months with sx/sooner prn.  ? ?01/15/22 NOTE: "Her menses are now every 2 wks to 2 months, lasting 7 days, mod flow, mild to mod dysmen, no meds taken. Pt states menses have always been irregular but were monthly last yr and only lasting 3 days. Had 1 episode AUB last yr after colectomy, GYN u/s ordered but not done. Sx resolved." ? ?Meds ordered this encounter  ?Medications  ? norethindrone (MICRONOR) 0.35 MG tablet  ?  Sig: Take 1 tablet (0.35 mg total) by mouth daily.  ?  Dispense:  84 tablet  ?  Refill:  0  ?  Order Specific Question:   Supervising Provider  ?  AnswerGae Dry [[id number]]  ? ? ?

## 2022-02-05 ENCOUNTER — Ambulatory Visit (HOSPITAL_COMMUNITY)
Admission: RE | Admit: 2022-02-05 | Discharge: 2022-02-05 | Disposition: A | Discharge status: Home / Self Care | Payer: Medicaid Other | Source: Ambulatory Visit | Attending: Family Medicine | Admitting: Family Medicine

## 2022-02-05 DIAGNOSIS — Z1231 Encounter for screening mammogram for malignant neoplasm of breast: Secondary | ICD-10-CM | POA: Diagnosis present

## 2022-04-06 ENCOUNTER — Other Ambulatory Visit: Payer: Self-pay

## 2022-04-06 DIAGNOSIS — D509 Iron deficiency anemia, unspecified: Secondary | ICD-10-CM | POA: Insufficient documentation

## 2022-04-09 ENCOUNTER — Telehealth: Payer: Self-pay | Admitting: Pharmacy Technician

## 2022-04-09 NOTE — Telephone Encounter (Signed)
Auth Submission: NO AUTH NEEDED Payer: [employer] MEDICAID Medication & CPT/J Code(s) submitted: Feraheme (ferumoxytol) [id number] Route of submission (phone, fax, portal): PHONE: [phone] Auth type: Buy/Bill Units/visits requested: X1 DOSE Reference number: [id number] Approval from: 04/09/22 to 07/11/22

## 2022-04-11 ENCOUNTER — Ambulatory Visit (INDEPENDENT_AMBULATORY_CARE_PROVIDER_SITE_OTHER): Payer: Medicaid Other

## 2022-04-11 VITALS — BP 130/85 | HR 69 | Temp 98.1°F | Resp 20 | Ht 66.0 in | Wt 154.4 lb

## 2022-04-11 DIAGNOSIS — D509 Iron deficiency anemia, unspecified: Secondary | ICD-10-CM

## 2022-04-11 MED ORDER — SODIUM CHLORIDE 0.9 % IV SOLN
510.0000 mg | Freq: Once | INTRAVENOUS | Status: AC
  Administered 2022-04-11: 510 mg via INTRAVENOUS
  Filled 2022-04-11: qty 17

## 2022-04-11 NOTE — Progress Notes (Signed)
Diagnosis: Iron Deficiency Anemia  Provider:  Marshell Garfinkel, MD  Procedure: Infusion  IV Type: Peripheral, IV Location: R Forearm  Feraheme (Ferumoxytol), Dose: 510 mg  Infusion Start Time: 0964  Infusion Stop Time: 0915  Post Infusion IV Care: Patient declined observation and Peripheral IV Discontinued  Discharge: Condition: Good, Destination: Home . AVS provided to patient.   Performed by:  Cleophus Molt, RN

## 2022-04-18 ENCOUNTER — Ambulatory Visit: Payer: Medicaid Other

## 2022-04-28 ENCOUNTER — Other Ambulatory Visit: Payer: Self-pay | Admitting: Obstetrics and Gynecology

## 2022-04-28 DIAGNOSIS — N939 Abnormal uterine and vaginal bleeding, unspecified: Secondary | ICD-10-CM

## 2022-05-03 ENCOUNTER — Encounter: Payer: Self-pay | Admitting: Pulmonary Disease

## 2022-12-05 ENCOUNTER — Other Ambulatory Visit (HOSPITAL_COMMUNITY): Payer: Self-pay | Admitting: Family Medicine

## 2022-12-05 DIAGNOSIS — Z1231 Encounter for screening mammogram for malignant neoplasm of breast: Secondary | ICD-10-CM

## 2023-02-08 ENCOUNTER — Ambulatory Visit (HOSPITAL_COMMUNITY)
Admission: RE | Admit: 2023-02-08 | Discharge: 2023-02-08 | Disposition: A | Discharge status: Home / Self Care | Payer: Medicaid Other | Source: Ambulatory Visit | Attending: Family Medicine | Admitting: Family Medicine

## 2023-02-08 DIAGNOSIS — Z1231 Encounter for screening mammogram for malignant neoplasm of breast: Secondary | ICD-10-CM | POA: Insufficient documentation

## 2023-10-29 IMAGING — MG MM DIGITAL SCREENING BILAT W/ TOMO AND CAD
6 of 10 series · 6 of 30 positions shown · non-contrast
Comparison: Previous exam(s).

CLINICAL DATA: Screening.

EXAM:
DIGITAL SCREENING BILATERAL MAMMOGRAM WITH TOMOSYNTHESIS AND CAD
TECHNIQUE: Bilateral screening digital craniocaudal and mediolateral oblique
mammograms were obtained. Bilateral screening digital breast
tomosynthesis was performed. The images were evaluated with
computer-aided detection.

[L MLO synth-2D (1 of 2)]
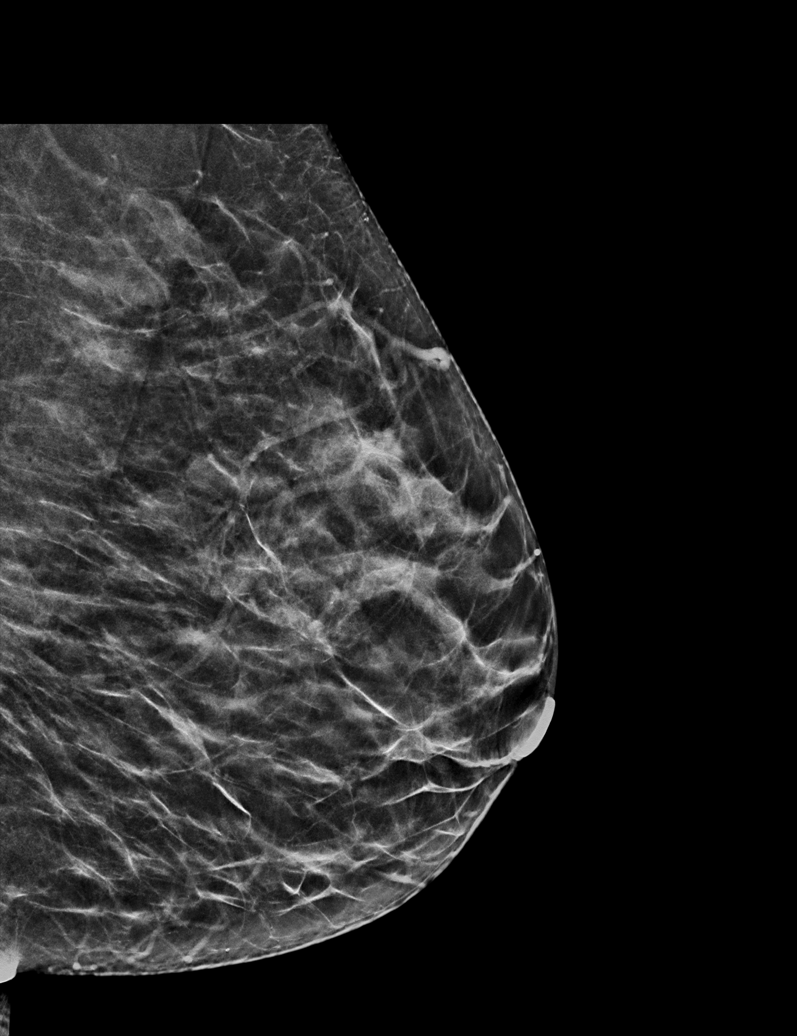

[R CC synth-2D]
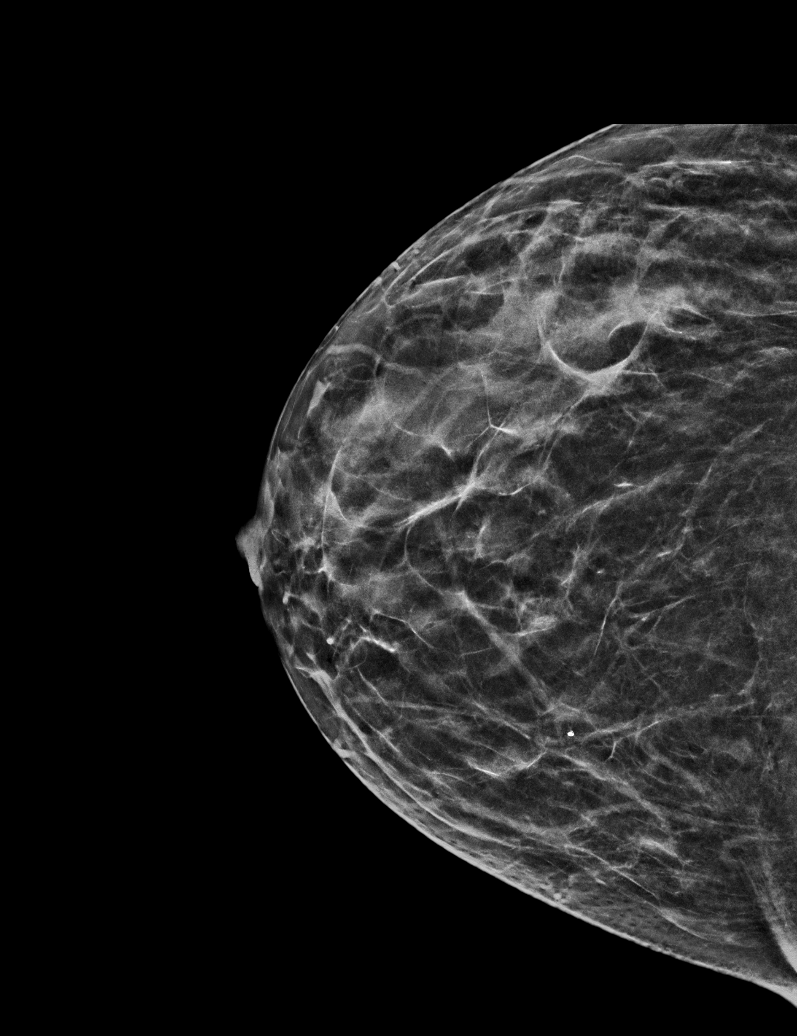

[R MLO synth-2D]
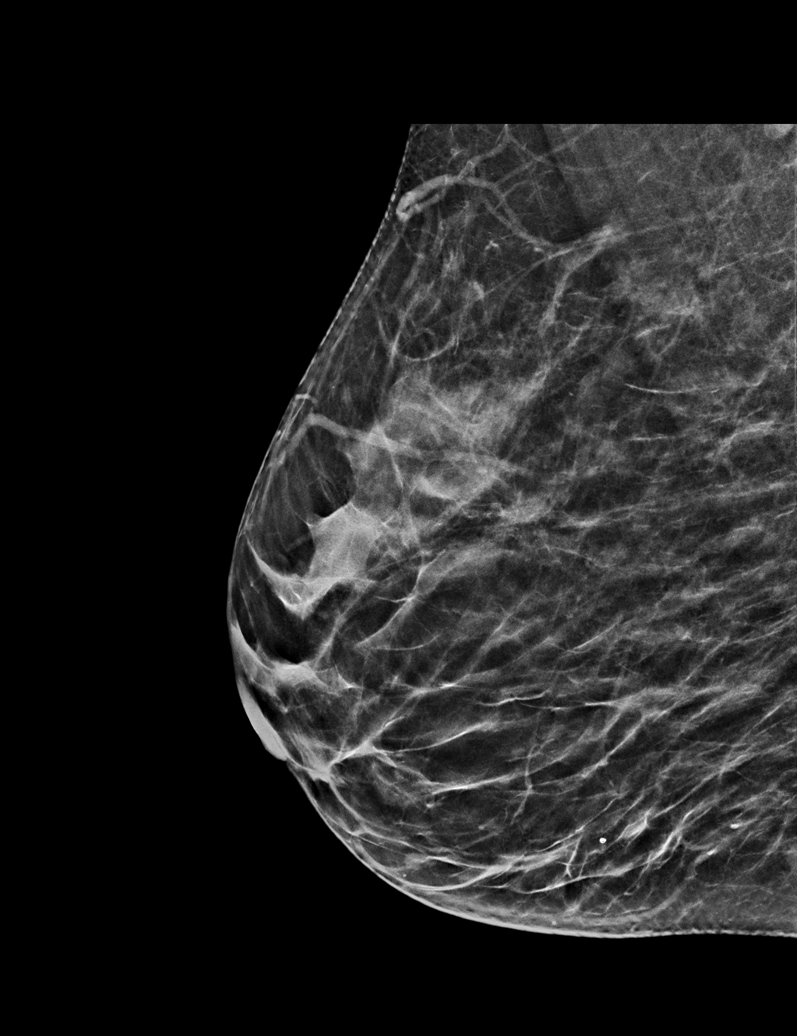

[L CC synth-2D]
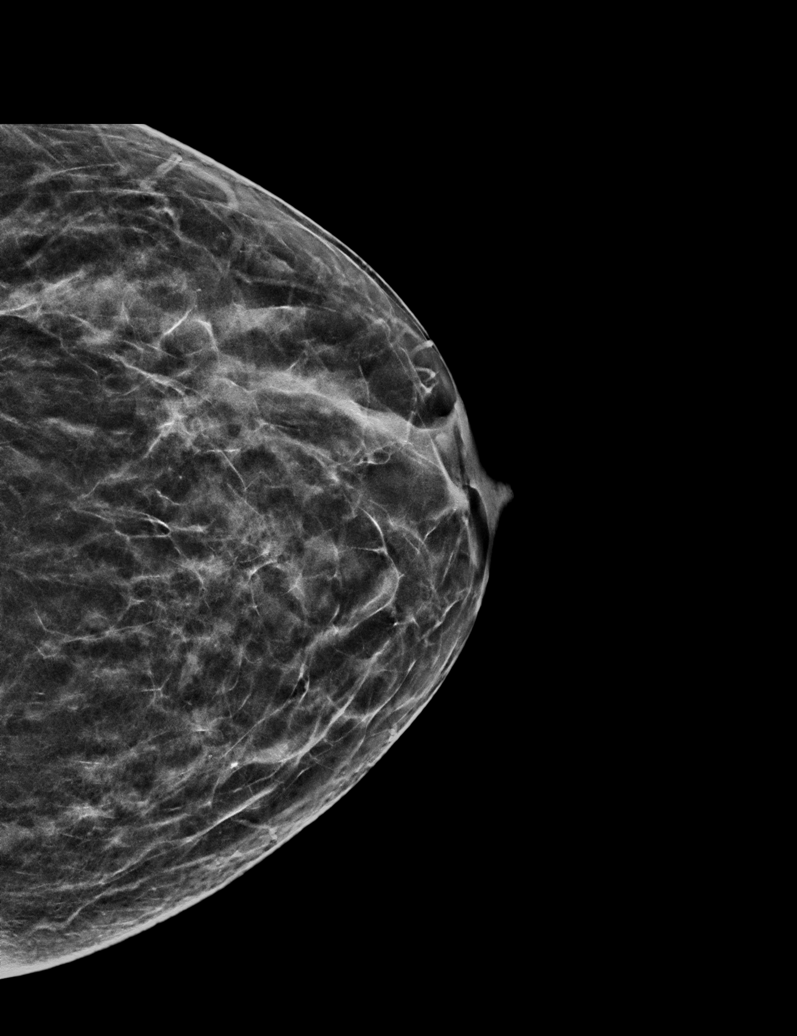

[L MLO synth-2D (2 of 2)]
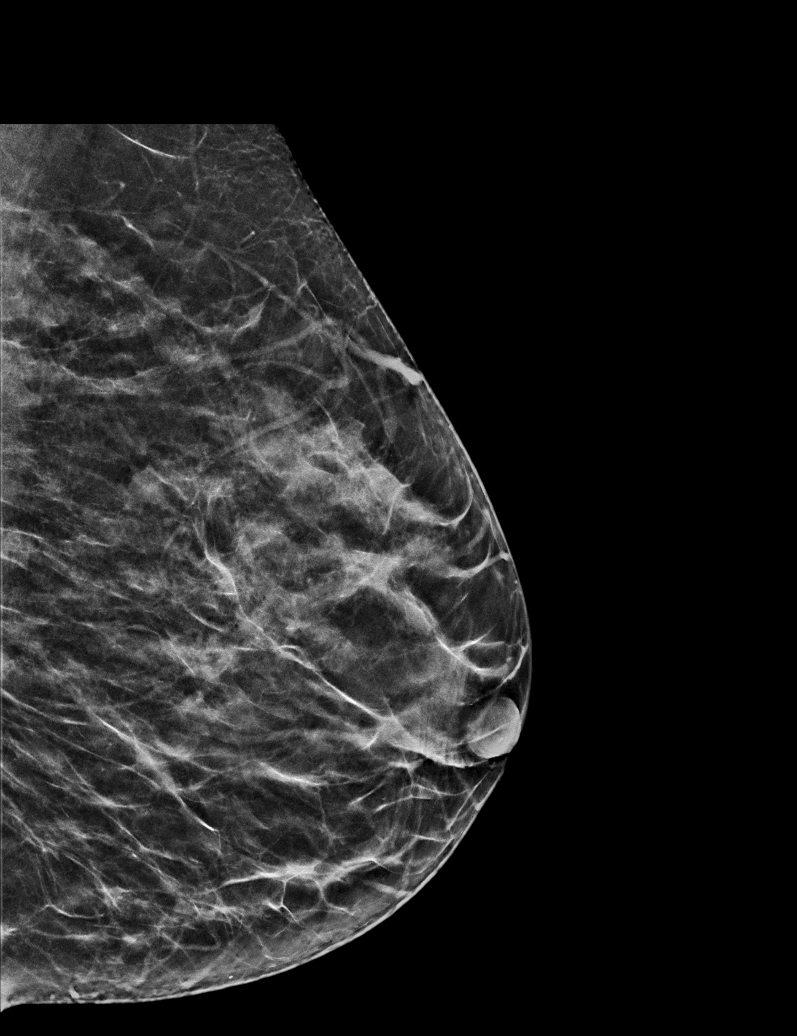

[L MLO tomo · tomo slice 25/50.0]
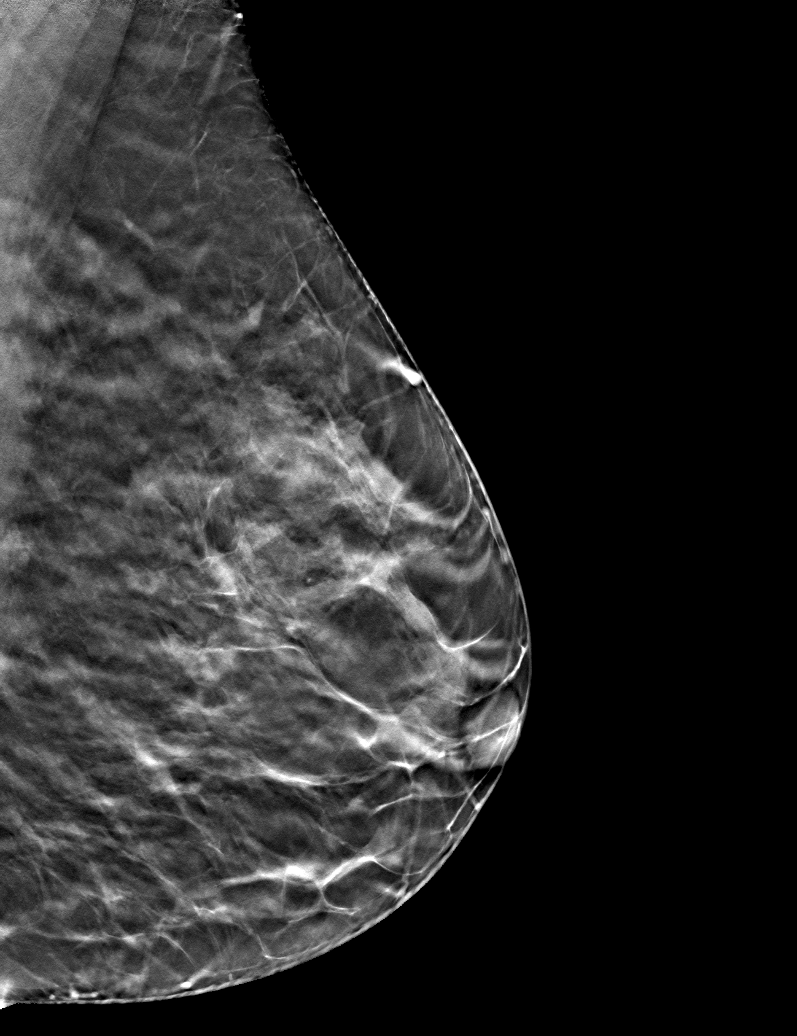

[6 of 30 positions shown; findings below may reference images not displayed]

ACR Breast Density Category c: The breast tissue is heterogeneously
dense, which may obscure small masses.
FINDINGS: There are no findings suspicious for malignancy.
IMPRESSION: No mammographic evidence of malignancy. A result letter of this
screening mammogram will be mailed directly to the patient.

RECOMMENDATION:
Screening mammogram in one year. (Code:Q3-W-BC3)

BI-RADS CATEGORY  1: Negative.

## 2024-01-02 ENCOUNTER — Other Ambulatory Visit (HOSPITAL_COMMUNITY): Payer: Self-pay | Admitting: Family Medicine

## 2024-01-02 DIAGNOSIS — Z1231 Encounter for screening mammogram for malignant neoplasm of breast: Secondary | ICD-10-CM

## 2024-02-21 ENCOUNTER — Ambulatory Visit (HOSPITAL_COMMUNITY)

## 2024-10-07 ENCOUNTER — Ambulatory Visit: Admitting: Obstetrics

## 2024-11-09 NOTE — Progress Notes (Unsigned)
" ° ° °  GYNECOLOGY PROGRESS NOTE  Subjective:  PCP: Zhou-Talbert, Stephens RAMAN, MD  Patient ID: Felicia Huff, female    DOB: 06/26/1971, 54 y.o.   MRN: [id number]  HPI  Patient is a 54 y.o. [id number] female who presents to discuss menopausal symptoms.  NILM pap 03/01/21 (5 year plan).  It's been over a year since her last period.  Having severe hot flashes for several years that make her feel like she is going to pass out.  She says she feels like she lost her brain and is very forgetful.  She also has no sex drive. Previously seeing [employer], PA. She does smoke cigarettes, PMH of HTN, and multiple allergies to SSRIs.   {Common ambulatory SmartLinks:19316}  Review of Systems {ros; complete:30496}   Objective:   Blood pressure (!) 148/99, pulse 84, weight 135 lb 4.8 oz (61.4 kg). Body mass index is 21.84 kg/m.  General appearance: {general exam:16600} Abdomen: {abdominal exam:16834} Pelvic: {pelvic exam:16852::cervix normal in appearance,external genitalia normal,no adnexal masses or tenderness,no cervical motion tenderness,rectovaginal septum normal,uterus normal size, shape, and consistency,vagina normal without discharge} Extremities: {extremity exam:5109} Neurologic: {neuro exam:17854}   Assessment/Plan:   1. Hot flashes due to menopause   2. Weight loss, unintentional    54 y.o. [id number] post-menopausal for about 1.58yrs, now with debilitating hot flashes, mood swings, and brain fog, desiring treatment. She unfortunately is a daily smoker and has PMH of HTN, not well-controlled. Due to breast cancer in her mother, pt prefers to avoid HRT. We discussed that her medical hx would preclude safe use as well, and she is ok with that.   Pt also with some unintentional weight loss, reports 20lbs without trying over the past several months, and has never had a lung cancer screen despite being a daily smoker.   Check TSH for the weight loss and hot flashes Start Effexor  --  SE and dosing reviewed. Pt to MyChart in 1 month on progress -- will change med or increase dose if needed.  Is seeing PCP next week -- she will mention the unintentional weight loss and inquire about lung cancer screening, no prior.   Follow up 6 mos, sooner prn    Estil Mangle, DO [facility] "

## 2024-11-12 ENCOUNTER — Ambulatory Visit: Admitting: Obstetrics

## 2024-11-12 ENCOUNTER — Encounter: Payer: Self-pay | Admitting: Obstetrics

## 2024-11-12 VITALS — BP 148/99 | HR 84 | Wt 135.3 lb

## 2024-11-12 DIAGNOSIS — Z803 Family history of malignant neoplasm of breast: Secondary | ICD-10-CM | POA: Diagnosis not present

## 2024-11-12 DIAGNOSIS — F17210 Nicotine dependence, cigarettes, uncomplicated: Secondary | ICD-10-CM | POA: Diagnosis not present

## 2024-11-12 DIAGNOSIS — R634 Abnormal weight loss: Secondary | ICD-10-CM | POA: Diagnosis not present

## 2024-11-12 DIAGNOSIS — I10 Essential (primary) hypertension: Secondary | ICD-10-CM

## 2024-11-12 DIAGNOSIS — N951 Menopausal and female climacteric states: Secondary | ICD-10-CM | POA: Diagnosis not present

## 2024-11-12 DIAGNOSIS — F17209 Nicotine dependence, unspecified, with unspecified nicotine-induced disorders: Secondary | ICD-10-CM

## 2024-11-12 MED ORDER — VENLAFAXINE HCL ER 37.5 MG PO CP24: 37.5000 mg | ORAL_CAPSULE | Freq: Every day | ORAL | 5 refills | Status: DC

## 2024-11-13 LAB — TSH RFX ON ABNORMAL TO FREE T4: TSH: 3.12 u[IU]/mL (ref 0.450–4.500)

## 2024-11-16 DIAGNOSIS — R634 Abnormal weight loss: Secondary | ICD-10-CM | POA: Insufficient documentation

## 2024-11-16 DIAGNOSIS — N951 Menopausal and female climacteric states: Secondary | ICD-10-CM | POA: Insufficient documentation

## 2024-11-16 DIAGNOSIS — F17209 Nicotine dependence, unspecified, with unspecified nicotine-induced disorders: Secondary | ICD-10-CM | POA: Insufficient documentation

## 2024-11-17 ENCOUNTER — Ambulatory Visit: Payer: Self-pay | Admitting: Obstetrics

## 2024-12-01 ENCOUNTER — Other Ambulatory Visit (HOSPITAL_COMMUNITY): Payer: Self-pay | Admitting: Family Medicine

## 2024-12-01 DIAGNOSIS — Z1231 Encounter for screening mammogram for malignant neoplasm of breast: Secondary | ICD-10-CM

## 2024-12-09 ENCOUNTER — Other Ambulatory Visit: Payer: Self-pay | Admitting: Obstetrics

## 2024-12-09 DIAGNOSIS — N951 Menopausal and female climacteric states: Secondary | ICD-10-CM

## 2024-12-09 NOTE — Telephone Encounter (Signed)
 Pharmacy requests 90 day supply and icd10 code: N95.1

## 2024-12-10 ENCOUNTER — Ambulatory Visit (HOSPITAL_COMMUNITY)
Admission: RE | Admit: 2024-12-10 | Discharge: 2024-12-10 | Disposition: A | Source: Ambulatory Visit | Attending: Family Medicine

## 2024-12-10 ENCOUNTER — Encounter (HOSPITAL_COMMUNITY): Payer: Self-pay

## 2024-12-10 DIAGNOSIS — Z1231 Encounter for screening mammogram for malignant neoplasm of breast: Secondary | ICD-10-CM
